# Patient Record
Sex: Male | Born: 1990
Health system: Southern US, Community
[De-identification: ages and names within clinical notes are randomized; demographics above are authoritative.]

## PROBLEM LIST (undated history)

## (undated) HISTORY — PX: NO PAST SURGERIES: SHX2092

---

## 2012-11-16 ENCOUNTER — Ambulatory Visit (INDEPENDENT_AMBULATORY_CARE_PROVIDER_SITE_OTHER): Payer: BC Managed Care – PPO | Admitting: Physician Assistant

## 2012-11-16 VITALS — BP 110/78 | HR 88 | Temp 97.5°F | Resp 16 | Wt 199.0 lb

## 2012-11-16 DIAGNOSIS — Z113 Encounter for screening for infections with a predominantly sexual mode of transmission: Secondary | ICD-10-CM

## 2012-11-16 DIAGNOSIS — R369 Urethral discharge, unspecified: Secondary | ICD-10-CM

## 2012-11-16 MED ORDER — AZITHROMYCIN 500 MG PO TABS: 1000.0000 mg | ORAL_TABLET | Freq: Once | ORAL | Status: DC

## 2012-11-16 MED ORDER — CEFTRIAXONE SODIUM 1 G IJ SOLR
250.0000 mg | Freq: Once | INTRAMUSCULAR | Status: AC
  Administered 2012-11-16: 250 mg via INTRAMUSCULAR

## 2012-11-16 NOTE — Progress Notes (Signed)
  Subjective:    Patient ID: Raymond Knapp, male    DOB: May 11, 1990, 22 y.o.   MRN: [id number]  HPI   [name] is a very pleasant 22 yr old male here with concern for a couple weeks of penile discharge.  States that for the past month "something felt different" but he had no visible symptoms.  Within the last couple weeks he has noted a small amount of discharge from the penis.  Was clear at first, now kind of whitish.  He denies dysuria, testicular pain, penile pain.  He denies rashes, lesions, sores.  He was sexually active in early September with a male partner.  He did not use a condom during that encounter.  He has not been active since.  He is no longer with this partner.  He was tested at the health department shortly after that encounter - assumes he tested negative as he never received a phone call.  He was asymptomatic at the time and was not treated for anything.  He otherwise feels well today.     Review of Systems  Constitutional: Negative for fever and chills.  HENT: Negative.   Respiratory: Negative.   Cardiovascular: Negative.   Gastrointestinal: Negative for nausea, vomiting and abdominal pain.  Genitourinary: Positive for discharge. Negative for dysuria, frequency, hematuria, flank pain, penile swelling, scrotal swelling, genital sores, penile pain and testicular pain.  Musculoskeletal: Negative.   Skin: Negative.   Neurological: Negative.        Objective:   Physical Exam  Vitals reviewed. Constitutional: He is oriented to person, place, and time. He appears well-developed and well-nourished. No distress.  HENT:  Head: Normocephalic and atraumatic.  Eyes: Conjunctivae are normal. No scleral icterus.  Cardiovascular: Normal rate, regular rhythm and normal heart sounds.   Pulmonary/Chest: Effort normal and breath sounds normal. He has no wheezes. He has no rales.  Abdominal: Soft. There is no tenderness.  Neurological: He is alert and oriented to person, place, and  time.  Skin: Skin is warm and dry.  Psychiatric: He has a normal mood and affect. His behavior is normal.        Assessment & Plan:  Penile discharge - Plan: GC/Chlamydia Probe Amp, azithromycin (ZITHROMAX) 500 MG tablet, cefTRIAXone (ROCEPHIN) injection 250 mg  Screen for STD (sexually transmitted disease) - Plan: GC/Chlamydia Probe Amp, RPR, HIV antibody   [name] is a very pleasant 22 yr old male complaining of penile discharge.  He had unprotected sex in early Sept - STI testing immediately after that encounter was negative.  Given symptoms will treat empirically with ceftriaxone 250mg  IM and azithro 1g PO.  Uriprobe pending.  HIV and RPR also pending.  Discussed rationale for empiric treatment and pt agrees with this plan.  Discussed importance of condom use with every sexual encounter.  Will follow up on labs and treat further if necessary.  Pt to RTC if symptoms worsening or not improving.

## 2012-11-16 NOTE — Patient Instructions (Signed)
We have given you an injection of antibiotics in clinic today.  You also need to take the two pills that were sent to your pharmacy at once today.  These medicines will cover your for both gonorrhea and chlamydia.  No sexual activity for the next 7 days to make sure any potential infection has cleared.  Condoms with every sexual encounter going forward.  I will let you know as soon as your labs are back.  Please let us know if any symptoms worsen or do not improve.   Safe Sex Safe sex is about reducing the risk of giving or getting a sexually transmitted disease (STD). STDs are spread through sexual contact involving the genitals, mouth, or rectum. Some STDS can be cured and others cannot. Safe sex can also prevent unintended pregnancies.  SAFE SEX PRACTICES  Limit your sexual activity to only one partner who is only having sex with you.  Talk to your partner about their past partners, past STDs, and drug use.  Use a condom every time you have sexual intercourse. This includes vaginal, oral, and anal sexual activity. Both females and males should wear condoms during oral sex. Only use latex or polyurethane condoms and water-based lubricants. Petroleum-based lubricants or oils used to lubricate a condom will weaken the condom and increase the chance that it will break. The condom should be in place from the beginning to the end of sexual activity. Wearing a condom reduces, but does not completely eliminate, your risk of getting or giving a STD. STDs can be spread by contact with skin of surrounding areas.  Get vaccinated for hepatitis B and HPV.  Avoid alcohol and recreational drugs which can affect your judgement. You may forget to use a condom or participate in high-risk sex.  For females, avoid douching after sexual intercourse. Douching can spread an infection farther into the reproductive tract.  Check your body for signs of sores, blisters, rashes, or unusual discharge. See your caregiver if  you notice any of these signs.  Avoid sexual contact if you have symptoms of an infection or are being treated for an STD. If you or your partner has herpes, avoid sexual contact when blisters are present. Use condoms at all other times.  See your caregiver for regular screenings, examinations, and tests for STDs. Before having sex with a new partner, each of you should be screened for STDs and talk about the results with your partner. BENEFITS OF SAFE SEX   There is less of a chance of getting or giving an STD.  You can prevent unwanted or unintended pregnancies.  By discussing safer sex concerns with your partner, you may increase feelings of intimacy, comfort, trust, and honesty between the both of you. Document Released: 02/08/2004 Document Revised: 09/25/2011 Document Reviewed: 06/24/2011 [facility] Patient Information 2014 [place], [place].

## 2012-11-17 LAB — GC/CHLAMYDIA PROBE AMP: GC Probe RNA: NEGATIVE

## 2012-11-17 LAB — RPR

## 2012-11-18 ENCOUNTER — Encounter: Payer: Self-pay | Admitting: *Deleted

## 2015-04-14 DIAGNOSIS — H52223 Regular astigmatism, bilateral: Secondary | ICD-10-CM | POA: Diagnosis not present

## 2015-04-14 DIAGNOSIS — H5213 Myopia, bilateral: Secondary | ICD-10-CM | POA: Diagnosis not present

## 2016-02-22 DIAGNOSIS — M545 Low back pain: Secondary | ICD-10-CM | POA: Diagnosis not present

## 2016-02-22 DIAGNOSIS — G8929 Other chronic pain: Secondary | ICD-10-CM | POA: Diagnosis not present

## 2016-02-23 ENCOUNTER — Other Ambulatory Visit: Payer: Self-pay | Admitting: Orthopedic Surgery

## 2016-02-23 DIAGNOSIS — R202 Paresthesia of skin: Secondary | ICD-10-CM

## 2016-02-23 DIAGNOSIS — R52 Pain, unspecified: Secondary | ICD-10-CM

## 2016-02-23 DIAGNOSIS — R531 Weakness: Secondary | ICD-10-CM

## 2016-02-29 ENCOUNTER — Ambulatory Visit
Admission: RE | Admit: 2016-02-29 | Discharge: 2016-02-29 | Disposition: A | Discharge status: Home / Self Care | Payer: 59 | Source: Ambulatory Visit | Attending: Orthopedic Surgery | Admitting: Orthopedic Surgery

## 2016-02-29 DIAGNOSIS — G548 Other nerve root and plexus disorders: Secondary | ICD-10-CM | POA: Diagnosis not present

## 2016-02-29 DIAGNOSIS — R52 Pain, unspecified: Secondary | ICD-10-CM

## 2016-02-29 DIAGNOSIS — R531 Weakness: Secondary | ICD-10-CM

## 2016-02-29 DIAGNOSIS — R202 Paresthesia of skin: Secondary | ICD-10-CM

## 2016-02-29 DIAGNOSIS — M48061 Spinal stenosis, lumbar region without neurogenic claudication: Secondary | ICD-10-CM | POA: Diagnosis not present

## 2016-02-29 DIAGNOSIS — M5127 Other intervertebral disc displacement, lumbosacral region: Secondary | ICD-10-CM | POA: Diagnosis not present

## 2016-03-01 DIAGNOSIS — M5417 Radiculopathy, lumbosacral region: Secondary | ICD-10-CM | POA: Diagnosis not present

## 2016-03-01 DIAGNOSIS — M5127 Other intervertebral disc displacement, lumbosacral region: Secondary | ICD-10-CM | POA: Diagnosis not present

## 2016-11-14 ENCOUNTER — Encounter: Payer: Self-pay | Admitting: Emergency Medicine

## 2016-11-14 ENCOUNTER — Ambulatory Visit (INDEPENDENT_AMBULATORY_CARE_PROVIDER_SITE_OTHER): Payer: 59 | Admitting: Emergency Medicine

## 2016-11-14 VITALS — BP 102/60 | HR 95 | Temp 98.4°F | Resp 16 | Ht 74.5 in | Wt 226.8 lb

## 2016-11-14 DIAGNOSIS — R1030 Lower abdominal pain, unspecified: Secondary | ICD-10-CM | POA: Diagnosis not present

## 2016-11-14 LAB — POCT URINALYSIS DIP (MANUAL ENTRY)
BILIRUBIN UA: NEGATIVE
GLUCOSE UA: NEGATIVE mg/dL
Ketones, POC UA: NEGATIVE mg/dL
Leukocytes, UA: NEGATIVE
NITRITE UA: NEGATIVE
Protein Ur, POC: NEGATIVE mg/dL
RBC UA: NEGATIVE
Spec Grav, UA: 1.02 (ref 1.010–1.025)
Urobilinogen, UA: 1 E.U./dL
pH, UA: 7 (ref 5.0–8.0)

## 2016-11-14 NOTE — Patient Instructions (Signed)
     IF you received an x-ray today, you will receive an invoice from [facility]. Please contact [facility] at [phone] with questions or concerns regarding your invoice.   IF you received labwork today, you will receive an invoice from [facility]. Please contact [facility] at [phone] with questions or concerns regarding your invoice.   Our billing staff will not be able to assist you with questions regarding bills from these companies.  You will be contacted with the lab results as soon as they are available. The fastest way to get your results is to activate your My Chart account. Instructions are located on the last page of this paperwork. If you have not heard from us regarding the results in 2 weeks, please contact this office.     

## 2016-11-14 NOTE — Progress Notes (Signed)
Raymond Knapp 26 y.o.   Chief Complaint  Patient presents with  . Groin Pain    x 2 weeks on and off    HISTORY OF PRESENT ILLNESS: [name] complaining of brief episodes of shooting sharp pain to groin area on and off x 2 weeks; pain lasts no more than a few seconds and is not associated with any other significant symptoms; triggered by nothing in particular; no pain with exercise. No urinary symptoms; no change in BM; no testicular lumps or masses.  Groin Pain  The patient's primary symptoms include pelvic pain and testicular pain. The patient's pertinent negatives include no genital injury, genital itching, genital lesions, penile discharge, penile pain, priapism or scrotal swelling. This is a new problem. The current episode started 1 to 4 weeks ago. The problem occurs intermittently. The problem has been waxing and waning. The pain is mild. Pertinent negatives include no abdominal pain, chest pain, chills, constipation, coughing, diarrhea, discolored urine, dysuria, fever, flank pain, frequency, headaches, hematuria, joint pain, nausea, painful intercourse, rash, shortness of breath, sore throat, urgency, urinary retention or vomiting.     Prior to Admission medications   Not on File    No Known Allergies  There are no active problems to display for this patient.   No past medical history on file.  No past surgical history on file.  Social History   Social History  . Marital status: Single    Spouse name: N/A  . Number of children: N/A  . Years of education: N/A   Occupational History  . Not on file.   Social History Main Topics  . Smoking status: Never Smoker  . Smokeless tobacco: Never Used  . Alcohol use No  . Drug use: No  . Sexual activity: Yes   Other Topics Concern  . Not on file   Social History Narrative  . No narrative on file    No family history on file.   Review of Systems  Constitutional: Negative for chills, fever and weight  loss.  HENT: Negative.  Negative for sore throat.   Eyes: Negative.   Respiratory: Negative for cough and shortness of breath.   Cardiovascular: Negative for chest pain, palpitations and leg swelling.  Gastrointestinal: Negative for abdominal pain, blood in stool, constipation, diarrhea, nausea and vomiting.  Genitourinary: Positive for pelvic pain and testicular pain. Negative for discharge, dysuria, flank pain, frequency, hematuria, penile pain, scrotal swelling and urgency.  Musculoskeletal: Negative for joint pain, myalgias and neck pain.  Skin: Negative for rash.  Neurological: Negative for dizziness and headaches.  Endo/Heme/Allergies: Negative.   All other systems reviewed and are negative.   Vitals:   11/14/16 1533  BP: 102/60  Pulse: 95  Resp: 16  Temp: 98.4 F (36.9 C)  SpO2: 99%    Physical Exam  Constitutional: He is oriented to person, place, and time. He appears well-developed and well-nourished.  HENT:  Head: Normocephalic and atraumatic.  Nose: Nose normal.  Mouth/Throat: Oropharynx is clear and moist.  Eyes: Pupils are equal, round, and reactive to light. Conjunctivae and EOM are normal.  Neck: Normal range of motion. Neck supple. No JVD present. No thyromegaly present.  Cardiovascular: Normal rate, regular rhythm and normal heart sounds.   Pulmonary/Chest: Effort normal and breath sounds normal.  Abdominal: Soft. Bowel sounds are normal. He exhibits no distension and no mass. There is no tenderness. There is no rebound and no guarding. No hernia. Hernia confirmed negative  in the right inguinal area and confirmed negative in the left inguinal area.  Genitourinary: Testes normal and penis normal. Cremasteric reflex is present. Right testis shows no mass, no swelling and no tenderness. Cremasteric reflex is not absent on the right side. Left testis shows no mass, no swelling and no tenderness. Cremasteric reflex is not absent on the left side. Uncircumcised. No  penile erythema or penile tenderness. No discharge found.  Musculoskeletal: Normal range of motion.  Lymphadenopathy:    He has no cervical adenopathy. No inguinal adenopathy noted on the right or left side.  Neurological: He is alert and oriented to person, place, and time. No sensory deficit. He exhibits normal muscle tone.  Skin: Skin is warm and dry. Capillary refill takes less than 2 seconds. No rash noted.  Psychiatric: He has a normal mood and affect. His behavior is normal.  Vitals reviewed.   Results for orders placed or performed in visit on 11/14/16 (from the past 24 hour(s))  POCT urinalysis dipstick     Status: None   Collection Time: 11/14/16  4:06 PM  Result Value Ref Range   Color, UA yellow yellow   Clarity, UA clear clear   Glucose, UA negative negative mg/dL   Bilirubin, UA negative negative   Ketones, POC UA negative negative mg/dL   Spec Grav, UA 4.098 1.191 - 1.025   Blood, UA negative negative   pH, UA 7.0 5.0 - 8.0   Protein Ur, POC negative negative mg/dL   Urobilinogen, UA 1.0 0.2 or 1.0 E.U./dL   Nitrite, UA Negative Negative   Leukocytes, UA Negative Negative    ASSESSMENT & PLAN: Raymond Knapp was seen today for groin pain.  Diagnoses and all orders for this visit:  Inguinal pain, unspecified laterality -     CBC with Differential/Platelet -     Comprehensive metabolic panel -     Urine Culture -     POCT urinalysis dipstick -     US Scrotum; Future   Patient Instructions       IF you received an x-ray today, you will receive an invoice from [facility]. Please contact [facility] at [phone] with questions or concerns regarding your invoice.   IF you received labwork today, you will receive an invoice from [facility]. Please contact [facility] at [phone] with questions or concerns regarding your invoice.   Our billing staff will not be able to assist you with questions regarding bills from these companies.  You will be  contacted with the lab results as soon as they are available. The fastest way to get your results is to activate your My Chart account. Instructions are located on the last page of this paperwork. If you have not heard from [place] regarding the results in 2 weeks, please contact this office.         Edwina Barth, MD [facility]

## 2016-11-15 LAB — CBC WITH DIFFERENTIAL/PLATELET
BASOS ABS: 0.1 10*3/uL (ref 0.0–0.2)
Basos: 1 %
EOS (ABSOLUTE): 0.2 10*3/uL (ref 0.0–0.4)
Eos: 2 %
Hematocrit: 45.2 % (ref 37.5–51.0)
Hemoglobin: 15.7 g/dL (ref 13.0–17.7)
Immature Grans (Abs): 0 10*3/uL (ref 0.0–0.1)
Immature Granulocytes: 0 %
LYMPHS ABS: 1.8 10*3/uL (ref 0.7–3.1)
Lymphs: 26 %
MCH: 28.9 pg (ref 26.6–33.0)
MCHC: 34.7 g/dL (ref 31.5–35.7)
MCV: 83 fL (ref 79–97)
MONOCYTES: 12 %
Monocytes Absolute: 0.9 10*3/uL (ref 0.1–0.9)
NEUTROS PCT: 59 %
Neutrophils Absolute: 4.2 10*3/uL (ref 1.4–7.0)
Platelets: 243 10*3/uL (ref 150–379)
RBC: 5.43 x10E6/uL (ref 4.14–5.80)
RDW: 13.2 % (ref 12.3–15.4)
WBC: 7.1 10*3/uL (ref 3.4–10.8)

## 2016-11-15 LAB — COMPREHENSIVE METABOLIC PANEL
ALBUMIN: 4.7 g/dL (ref 3.5–5.5)
ALT: 30 IU/L (ref 0–44)
AST: 21 IU/L (ref 0–40)
Albumin/Globulin Ratio: 1.8 (ref 1.2–2.2)
Alkaline Phosphatase: 86 IU/L (ref 39–117)
BUN/Creatinine Ratio: 17 (ref 9–20)
BUN: 17 mg/dL (ref 6–20)
Bilirubin Total: 0.7 mg/dL (ref 0.0–1.2)
CALCIUM: 9.5 mg/dL (ref 8.7–10.2)
CO2: 26 mmol/L (ref 20–29)
CREATININE: 0.99 mg/dL (ref 0.76–1.27)
Chloride: 100 mmol/L (ref 96–106)
GFR calc Af Amer: 122 mL/min/{1.73_m2} (ref >59)
GFR, EST NON AFRICAN AMERICAN: 105 mL/min/{1.73_m2} (ref >59)
GLUCOSE: 99 mg/dL (ref 65–99)
Globulin, Total: 2.6 g/dL (ref 1.5–4.5)
Potassium: 4.1 mmol/L (ref 3.5–5.2)
Sodium: 142 mmol/L (ref 134–144)
TOTAL PROTEIN: 7.3 g/dL (ref 6.0–8.5)

## 2016-11-15 LAB — URINE CULTURE: Organism ID, Bacteria: NO GROWTH

## 2016-11-19 ENCOUNTER — Other Ambulatory Visit: Payer: Self-pay | Admitting: Emergency Medicine

## 2016-11-19 DIAGNOSIS — R1030 Lower abdominal pain, unspecified: Secondary | ICD-10-CM

## 2016-12-11 ENCOUNTER — Ambulatory Visit: Payer: 59 | Admitting: Internal Medicine

## 2017-05-28 ENCOUNTER — Ambulatory Visit (INDEPENDENT_AMBULATORY_CARE_PROVIDER_SITE_OTHER): Payer: 59 | Admitting: Emergency Medicine

## 2017-05-28 ENCOUNTER — Encounter: Payer: Self-pay | Admitting: Emergency Medicine

## 2017-05-28 ENCOUNTER — Other Ambulatory Visit: Payer: Self-pay

## 2017-05-28 VITALS — BP 100/60 | HR 54 | Temp 97.9°F | Resp 16 | Ht 75.0 in | Wt 237.0 lb

## 2017-05-28 DIAGNOSIS — Z0000 Encounter for general adult medical examination without abnormal findings: Secondary | ICD-10-CM

## 2017-05-28 DIAGNOSIS — Z111 Encounter for screening for respiratory tuberculosis: Secondary | ICD-10-CM

## 2017-05-28 NOTE — Patient Instructions (Addendum)
   IF you received an x-ray today, you will receive an invoice from [facility]. Please contact [facility] at [phone] with questions or concerns regarding your invoice.   IF you received labwork today, you will receive an invoice from [facility]. Please contact [facility] at [phone] with questions or concerns regarding your invoice.   Our billing staff will not be able to assist you with questions regarding bills from these companies.  You will be contacted with the lab results as soon as they are available. The fastest way to get your results is to activate your My Chart account. Instructions are located on the last page of this paperwork. If you have not heard from us regarding the results in 2 weeks, please contact this office.      Health Maintenance, Male A healthy lifestyle and preventive care is important for your health and wellness. Ask your health care provider about what schedule of regular examinations is right for you. What should I know about weight and diet? Eat a Healthy Diet  Eat plenty of vegetables, fruits, whole grains, low-fat dairy products, and lean protein.  Do not eat a lot of foods high in solid fats, added sugars, or salt.  Maintain a Healthy Weight Regular exercise can help you achieve or maintain a healthy weight. You should:  Do at least 150 minutes of exercise each week. The exercise should increase your heart rate and make you sweat (moderate-intensity exercise).  Do strength-training exercises at least twice a week.  Watch Your Levels of Cholesterol and Blood Lipids  Have your blood tested for lipids and cholesterol every 5 years starting at 26 years of age. If you are at high risk for heart disease, you should start having your blood tested when you are 26 years old. You may need to have your cholesterol levels checked more often if: ? Your lipid or cholesterol levels are high. ? You are older than 26 years of age. ? You  are at high risk for heart disease.  What should I know about cancer screening? Many types of cancers can be detected early and may often be prevented. Lung Cancer  You should be screened every year for lung cancer if: ? You are a current smoker who has smoked for at least 30 years. ? You are a former smoker who has quit within the past 15 years.  Talk to your health care provider about your screening options, when you should start screening, and how often you should be screened.  Colorectal Cancer  Routine colorectal cancer screening usually begins at 26 years of age and should be repeated every 5-10 years until you are 26 years old. You may need to be screened more often if early forms of precancerous polyps or small growths are found. Your health care provider may recommend screening at an earlier age if you have risk factors for colon cancer.  Your health care provider may recommend using home test kits to check for hidden blood in the stool.  A small camera at the end of a tube can be used to examine your colon (sigmoidoscopy or colonoscopy). This checks for the earliest forms of colorectal cancer.  Prostate and Testicular Cancer  Depending on your age and overall health, your health care provider may do certain tests to screen for prostate and testicular cancer.  Talk to your health care provider about any symptoms or concerns you have about testicular or prostate cancer.  Skin Cancer  Check your skin   from head to toe regularly.  Tell your health care provider about any new moles or changes in moles, especially if: ? There is a change in a mole's size, shape, or color. ? You have a mole that is larger than a pencil eraser.  Always use sunscreen. Apply sunscreen liberally and repeat throughout the day.  Protect yourself by wearing long sleeves, pants, a wide-brimmed hat, and sunglasses when outside.  What should I know about heart disease, diabetes, and high blood  pressure?  If you are 18-39 years of age, have your blood pressure checked every 3-5 years. If you are 40 years of age or older, have your blood pressure checked every year. You should have your blood pressure measured twice-once when you are at a hospital or clinic, and once when you are not at a hospital or clinic. Record the average of the two measurements. To check your blood pressure when you are not at a hospital or clinic, you can use: ? An automated blood pressure machine at a pharmacy. ? A home blood pressure monitor.  Talk to your health care provider about your target blood pressure.  If you are between 45-79 years old, ask your health care provider if you should take aspirin to prevent heart disease.  Have regular diabetes screenings by checking your fasting blood sugar level. ? If you are at a normal weight and have a low risk for diabetes, have this test once every three years after the age of 45. ? If you are overweight and have a high risk for diabetes, consider being tested at a younger age or more often.  A one-time screening for abdominal aortic aneurysm (AAA) by ultrasound is recommended for men aged 65-75 years who are current or former smokers. What should I know about preventing infection? Hepatitis B If you have a higher risk for hepatitis B, you should be screened for this virus. Talk with your health care provider to find out if you are at risk for hepatitis B infection. Hepatitis C Blood testing is recommended for:  Everyone born from 1945 through 1965.  Anyone with known risk factors for hepatitis C.  Sexually Transmitted Diseases (STDs)  You should be screened each year for STDs including gonorrhea and chlamydia if: ? You are sexually active and are younger than 26 years of age. ? You are older than 26 years of age and your health care provider tells you that you are at risk for this type of infection. ? Your sexual activity has changed since you were last  screened and you are at an increased risk for chlamydia or gonorrhea. Ask your health care provider if you are at risk.  Talk with your health care provider about whether you are at high risk of being infected with HIV. Your health care provider may recommend a prescription medicine to help prevent HIV infection.  What else can I do?  Schedule regular health, dental, and eye exams.  Stay current with your vaccines (immunizations).  Do not use any tobacco products, such as cigarettes, chewing tobacco, and e-cigarettes. If you need help quitting, ask your health care provider.  Limit alcohol intake to no more than 2 drinks per day. One drink equals 12 ounces of beer, 5 ounces of wine, or 1 ounces of hard liquor.  Do not use street drugs.  Do not share needles.  Ask your health care provider for help if you need support or information about quitting drugs.  Tell your health care   provider if you often feel depressed.  Tell your health care provider if you have ever been abused or do not feel safe at home. This information is not intended to replace advice given to you by your health care provider. Make sure you discuss any questions you have with your health care provider. Document Released: 06/29/2007 Document Revised: 08/30/2015 Document Reviewed: 10/04/2014 Elsevier Interactive Patient Education  2018 [employer]  

## 2017-05-28 NOTE — Progress Notes (Signed)
PPD Placement note Raymond Knapp, 27 y.o. male is here today for placement of PPD test Reason for PPD test: [employer] Pt taken PPD test before: no Verified in allergy area and with patient that they are not allergic to the products PPD is made of (Phenol or Tween). Yes Is patient taking any oral or IV steroid medication now or have they taken it in the last month? no Has the patient ever received the BCG vaccine?: no Has the patient been in recent contact with anyone known or suspected of having active TB disease?: no      Date of exposure (if applicable): N/a      Name of person they were exposed to (if applicable): N/a Patient's Country of origin?: [place] O: Alert and oriented in NAD. P:  PPD placed on 05/28/2017.  Patient advised to return for reading within 48-72 hours.

## 2017-05-28 NOTE — Progress Notes (Signed)
Raymond Knapp 26 y.o.   Chief Complaint  Patient presents with  . Annual Exam    to be a IT sales professional in [place]    HISTORY OF PRESENT ILLNESS: This is a 27 y.o. male Here for annual exam; no complaints and no medical concerns.   HPI   Prior to Admission medications   Not on File    No Known Allergies  Patient Active Problem List   Diagnosis Date Noted  . Inguinal pain 11/14/2016    No past medical history on file.  No past surgical history on file.  Social History   Socioeconomic History  . Marital status: Single    Spouse name: Not on file  . Number of children: Not on file  . Years of education: Not on file  . Highest education level: Not on file  Occupational History  . Not on file  Social Needs  . Financial resource strain: Not on file  . Food insecurity:    Worry: Not on file    Inability: Not on file  . Transportation needs:    Medical: Not on file    Non-medical: Not on file  Tobacco Use  . Smoking status: Never Smoker  . Smokeless tobacco: Never Used  Substance and Sexual Activity  . Alcohol use: No  . Drug use: No  . Sexual activity: Yes  Lifestyle  . Physical activity:    Days per week: Not on file    Minutes per session: Not on file  . Stress: Not on file  Relationships  . Social connections:    Talks on phone: Not on file    Gets together: Not on file    Attends religious service: Not on file    Active member of club or organization: Not on file    Attends meetings of clubs or organizations: Not on file    Relationship status: Not on file  . Intimate partner violence:    Fear of current or ex partner: Not on file    Emotionally abused: Not on file    Physically abused: Not on file    Forced sexual activity: Not on file  Other Topics Concern  . Not on file  Social History Narrative  . Not on file    No family history on file.   Review of Systems  Constitutional: Negative.  Negative for chills, fever, malaise/fatigue and weight  loss.  HENT: Negative for ear pain, hearing loss and sore throat.   Eyes: Negative.  Negative for blurred vision, double vision, discharge and redness.  Respiratory: Negative.  Negative for cough, hemoptysis, shortness of breath and wheezing.   Cardiovascular: Negative.  Negative for chest pain and palpitations.  Gastrointestinal: Negative.  Negative for abdominal pain, blood in stool, diarrhea, nausea and vomiting.  Genitourinary: Negative.  Negative for dysuria and hematuria.  Musculoskeletal: Negative.  Negative for back pain, myalgias and neck pain.  Skin: Negative.  Negative for rash.  Neurological: Negative.  Negative for dizziness and headaches.  Endo/Heme/Allergies: Negative.   All other systems reviewed and are negative.  Vitals:   05/28/17 1006  BP: 100/60  Pulse: (!) 54  Resp: 16  Temp: 97.9 F (36.6 C)     Physical Exam  Constitutional: He is oriented to person, place, and time. He appears well-developed and well-nourished.  HENT:  Head: Normocephalic and atraumatic.  Right Ear: External ear normal.  Left Ear: External ear normal.  Nose: Nose normal.  Mouth/Throat: Oropharynx is clear and moist.  Eyes: Pupils are equal, round, and reactive to light. Conjunctivae and EOM are normal.  Neck: Normal range of motion. Neck supple. No JVD present.  Cardiovascular: Normal rate, regular rhythm and normal heart sounds.  Pulmonary/Chest: Effort normal and breath sounds normal. No respiratory distress.  Abdominal: Soft. Bowel sounds are normal. He exhibits no distension and no mass. There is no tenderness. There is no rebound. No hernia.  Genitourinary: Rectum normal.  Musculoskeletal: Normal range of motion. He exhibits no edema or tenderness.  Lymphadenopathy:    He has no cervical adenopathy.  Neurological: He is alert and oriented to person, place, and time. No sensory deficit. He exhibits normal muscle tone. Coordination normal.  Skin: Skin is warm and dry. Capillary  refill takes less than 2 seconds. No rash noted.  Psychiatric: He has a normal mood and affect. His behavior is normal.  Vitals reviewed.    ASSESSMENT & PLAN: Raymond Knapp was seen today for annual exam.  Diagnoses and all orders for this visit:  Routine general medical examination at a health care facility -     CBC with Differential -     Comprehensive metabolic panel -     Lipid panel -     Hemoglobin A1c  PPD screening test -     TB Skin Test    Patient Instructions       IF you received an x-ray today, you will receive an invoice from [facility]. Please contact [facility] at [phone] with questions or concerns regarding your invoice.   IF you received labwork today, you will receive an invoice from [facility]. Please contact [facility] at [phone] with questions or concerns regarding your invoice.   Our billing staff will not be able to assist you with questions regarding bills from these companies.  You will be contacted with the lab results as soon as they are available. The fastest way to get your results is to activate your My Chart account. Instructions are located on the last page of this paperwork. If you have not heard from [place] regarding the results in 2 weeks, please contact this office.      Health Maintenance, Male A healthy lifestyle and preventive care is important for your health and wellness. Ask your health care provider about what schedule of regular examinations is right for you. What should I know about weight and diet? Eat a Healthy Diet  Eat plenty of vegetables, fruits, whole grains, low-fat dairy products, and lean protein.  Do not eat a lot of foods high in solid fats, added sugars, or salt.  Maintain a Healthy Weight Regular exercise can help you achieve or maintain a healthy weight. You should:  Do at least 150 minutes of exercise each week. The exercise should increase your heart rate and make you sweat  (moderate-intensity exercise).  Do strength-training exercises at least twice a week.  Watch Your Levels of Cholesterol and Blood Lipids  Have your blood tested for lipids and cholesterol every 5 years starting at 26 years of age. If you are at high risk for heart disease, you should start having your blood tested when you are 26 years old. You may need to have your cholesterol levels checked more often if: ? Your lipid or cholesterol levels are high. ? You are older than 26 years of age. ? You are at high risk for heart disease.  What should I know about cancer screening? Many types of cancers can be detected early and may often be  prevented. Lung Cancer  You should be screened every year for lung cancer if: ? You are a current smoker who has smoked for at least 30 years. ? You are a former smoker who has quit within the past 15 years.  Talk to your health care provider about your screening options, when you should start screening, and how often you should be screened.  Colorectal Cancer  Routine colorectal cancer screening usually begins at 26 years of age and should be repeated every 5-10 years until you are 26 years old. You may need to be screened more often if early forms of precancerous polyps or small growths are found. Your health care provider may recommend screening at an earlier age if you have risk factors for colon cancer.  Your health care provider may recommend using home test kits to check for hidden blood in the stool.  A small camera at the end of a tube can be used to examine your colon (sigmoidoscopy or colonoscopy). This checks for the earliest forms of colorectal cancer.  Prostate and Testicular Cancer  Depending on your age and overall health, your health care provider may do certain tests to screen for prostate and testicular cancer.  Talk to your health care provider about any symptoms or concerns you have about testicular or prostate cancer.  Skin  Cancer  Check your skin from head to toe regularly.  Tell your health care provider about any new moles or changes in moles, especially if: ? There is a change in a mole's size, shape, or color. ? You have a mole that is larger than a pencil eraser.  Always use sunscreen. Apply sunscreen liberally and repeat throughout the day.  Protect yourself by wearing long sleeves, pants, a wide-brimmed hat, and sunglasses when outside.  What should I know about heart disease, diabetes, and high blood pressure?  If you are 3718-26 years of age, have your blood pressure checked every 3-5 years. If you are 26 years of age or older, have your blood pressure checked every year. You should have your blood pressure measured twice-once when you are at a hospital or clinic, and once when you are not at a hospital or clinic. Record the average of the two measurements. To check your blood pressure when you are not at a hospital or clinic, you can use: ? An automated blood pressure machine at a pharmacy. ? A home blood pressure monitor.  Talk to your health care provider about your target blood pressure.  If you are between 6145-26 years old, ask your health care provider if you should take aspirin to prevent heart disease.  Have regular diabetes screenings by checking your fasting blood sugar level. ? If you are at a normal weight and have a low risk for diabetes, have this test once every three years after the age of 26. ? If you are overweight and have a high risk for diabetes, consider being tested at a younger age or more often.  A one-time screening for abdominal aortic aneurysm (AAA) by ultrasound is recommended for men aged 65-75 years who are current or former smokers. What should I know about preventing infection? Hepatitis B If you have a higher risk for hepatitis B, you should be screened for this virus. Talk with your health care provider to find out if you are at risk for hepatitis B  infection. Hepatitis C Blood testing is recommended for:  Everyone born from [id number] through 1964.  Anyone with known risk factors  for hepatitis C.  Sexually Transmitted Diseases (STDs)  You should be screened each year for STDs including gonorrhea and chlamydia if: ? You are sexually active and are younger than 27 years of age. ? You are older than 26 years of age and your health care provider tells you that you are at risk for this type of infection. ? Your sexual activity has changed since you were last screened and you are at an increased risk for chlamydia or gonorrhea. Ask your health care provider if you are at risk.  Talk with your health care provider about whether you are at high risk of being infected with HIV. Your health care provider may recommend a prescription medicine to help prevent HIV infection.  What else can I do?  Schedule regular health, dental, and eye exams.  Stay current with your vaccines (immunizations).  Do not use any tobacco products, such as cigarettes, chewing tobacco, and e-cigarettes. If you need help quitting, ask your health care provider.  Limit alcohol intake to no more than 2 drinks per day. One drink equals 12 ounces of beer, 5 ounces of wine, or 1 ounces of hard liquor.  Do not use street drugs.  Do not share needles.  Ask your health care provider for help if you need support or information about quitting drugs.  Tell your health care provider if you often feel depressed.  Tell your health care provider if you have ever been abused or do not feel safe at home. This information is not intended to replace advice given to you by your health care provider. Make sure you discuss any questions you have with your health care provider. Document Released: 06/29/2007 Document Revised: 08/30/2015 Document Reviewed: 10/04/2014 Elsevier Interactive Patient Education  2018 Elsevier Inc.      Edwina Barth, MD [facility]

## 2017-05-29 LAB — COMPREHENSIVE METABOLIC PANEL
ALBUMIN: 4.8 g/dL (ref 3.5–5.5)
ALK PHOS: 84 IU/L (ref 39–117)
ALT: 65 IU/L — ABNORMAL HIGH (ref 0–44)
AST: 26 IU/L (ref 0–40)
Albumin/Globulin Ratio: 2.1 (ref 1.2–2.2)
BILIRUBIN TOTAL: 0.7 mg/dL (ref 0.0–1.2)
BUN / CREAT RATIO: 11 (ref 9–20)
BUN: 13 mg/dL (ref 6–20)
CO2: 23 mmol/L (ref 20–29)
Calcium: 9.6 mg/dL (ref 8.7–10.2)
Chloride: 102 mmol/L (ref 96–106)
Creatinine, Ser: 1.19 mg/dL (ref 0.76–1.27)
GFR calc Af Amer: 98 mL/min/{1.73_m2} (ref >59)
GFR calc non Af Amer: 84 mL/min/{1.73_m2} (ref >59)
GLOBULIN, TOTAL: 2.3 g/dL (ref 1.5–4.5)
GLUCOSE: 94 mg/dL (ref 65–99)
Potassium: 4.8 mmol/L (ref 3.5–5.2)
SODIUM: 140 mmol/L (ref 134–144)
Total Protein: 7.1 g/dL (ref 6.0–8.5)

## 2017-05-29 LAB — CBC WITH DIFFERENTIAL/PLATELET
Basophils Absolute: 0.1 10*3/uL (ref 0.0–0.2)
Basos: 1 %
EOS (ABSOLUTE): 0.3 10*3/uL (ref 0.0–0.4)
Eos: 5 %
Hematocrit: 47.3 % (ref 37.5–51.0)
Hemoglobin: 15.8 g/dL (ref 13.0–17.7)
IMMATURE GRANS (ABS): 0 10*3/uL (ref 0.0–0.1)
IMMATURE GRANULOCYTES: 0 %
LYMPHS: 31 %
Lymphocytes Absolute: 2.1 10*3/uL (ref 0.7–3.1)
MCH: 28.4 pg (ref 26.6–33.0)
MCHC: 33.4 g/dL (ref 31.5–35.7)
MCV: 85 fL (ref 79–97)
MONOS ABS: 0.6 10*3/uL (ref 0.1–0.9)
Monocytes: 8 %
NEUTROS PCT: 55 %
Neutrophils Absolute: 3.6 10*3/uL (ref 1.4–7.0)
Platelets: 228 10*3/uL (ref 150–379)
RBC: 5.56 x10E6/uL (ref 4.14–5.80)
RDW: 13.5 % (ref 12.3–15.4)
WBC: 6.6 10*3/uL (ref 3.4–10.8)

## 2017-05-29 LAB — LIPID PANEL
CHOLESTEROL TOTAL: 139 mg/dL (ref 100–199)
Chol/HDL Ratio: 3.2 ratio (ref 0.0–5.0)
HDL: 43 mg/dL (ref >39)
LDL Calculated: 81 mg/dL (ref 0–99)
TRIGLYCERIDES: 74 mg/dL (ref 0–149)
VLDL Cholesterol Cal: 15 mg/dL (ref 5–40)

## 2017-05-29 LAB — HEMOGLOBIN A1C
ESTIMATED AVERAGE GLUCOSE: 97 mg/dL
Hgb A1c MFr Bld: 5 % (ref 4.8–5.6)

## 2017-05-31 ENCOUNTER — Encounter: Payer: Self-pay | Admitting: *Deleted

## 2017-05-31 ENCOUNTER — Ambulatory Visit (INDEPENDENT_AMBULATORY_CARE_PROVIDER_SITE_OTHER): Payer: 59 | Admitting: Urgent Care

## 2017-05-31 DIAGNOSIS — Z23 Encounter for immunization: Secondary | ICD-10-CM

## 2017-05-31 LAB — TB SKIN TEST
Induration: 0.2 mm
TB Skin Test: NEGATIVE

## 2017-05-31 NOTE — Progress Notes (Signed)
Immunization read

## 2017-05-31 NOTE — Patient Instructions (Signed)
pcp

## 2017-06-02 DIAGNOSIS — Z0110 Encounter for examination of ears and hearing without abnormal findings: Secondary | ICD-10-CM | POA: Diagnosis not present

## 2017-06-03 ENCOUNTER — Encounter: Payer: Self-pay | Admitting: *Deleted

## 2017-11-05 ENCOUNTER — Other Ambulatory Visit: Payer: Self-pay

## 2017-11-05 ENCOUNTER — Ambulatory Visit: Payer: No Typology Code available for payment source | Admitting: Physician Assistant

## 2017-11-05 ENCOUNTER — Encounter: Payer: Self-pay | Admitting: Physician Assistant

## 2017-11-05 VITALS — BP 126/78 | HR 67 | Temp 98.2°F | Resp 18 | Ht 75.0 in | Wt 234.6 lb

## 2017-11-05 DIAGNOSIS — Z113 Encounter for screening for infections with a predominantly sexual mode of transmission: Secondary | ICD-10-CM | POA: Diagnosis not present

## 2017-11-05 NOTE — Patient Instructions (Addendum)
If you have lab work done today you will be contacted with your lab results within the next 2 weeks.  If you have not heard from [place] then please contact us. The fastest way to get your results is to register for My Chart.   Safe Sex Practicing safe sex means taking steps before and during sex to reduce your risk of:  Getting an STD (sexually transmitted disease).  Giving your partner an STD.  Unwanted pregnancy.  How can I practice safe sex?  To practice safe sex:  Limit your sexual partners to only one partner who is having sex with only you.  Avoid using alcohol and recreational drugs before having sex. These substances can affect your judgment.  Before having sex with a new partner: ? Talk to your partner about past partners, past STDs, and drug use. ? You and your partner should be screened for STDs and discuss the results with each other.  Check your body regularly for sores, blisters, rashes, or unusual discharge. If you notice any of these problems, visit your health care provider.  If you have symptoms of an infection or you are being treated for an STD, avoid sexual contact.  While having sex, use a condom. Make sure to: ? Use a condom every time you have vaginal, oral, or anal sex. Both females and males should wear condoms during oral sex. ? Keep condoms in place from the beginning to the end of sexual activity. ? Use a latex condom, if possible. Latex condoms offer the best protection. ? Use only water-based lubricants or oils to lubricate a condom. Using petroleum-based lubricants or oils will weaken the condom and increase the chance that it will break.  See your health care provider for regular screenings, exams, and tests for STDs.  Talk with your health care provider about the form of birth control (contraception) that is best for you.  Get vaccinated against hepatitis B and human papillomavirus (HPV).  If you are at risk of being infected with HIV  (human immunodeficiency virus), talk with your health care provider about taking a prescription medicine to prevent HIV infection. You are considered at risk for HIV if: ? You are a man who has sex with other men. ? You are a heterosexual man or woman who is sexually active with more than one partner. ? You take drugs by injection. ? You are sexually active with a partner who has HIV.  This information is not intended to replace advice given to you by your health care provider. Make sure you discuss any questions you have with your health care provider. Document Released: 02/08/2004 Document Revised: 05/17/2015 Document Reviewed: 11/20/2014 Elsevier Interactive Patient Education  2018 [employer].   IF you received an x-ray today, you will receive an invoice from [facility]. Please contact [facility] at [phone] with questions or concerns regarding your invoice.   IF you received labwork today, you will receive an invoice from [facility]. Please contact [facility] at [phone] with questions or concerns regarding your invoice.   Our billing staff will not be able to assist you with questions regarding bills from these companies.  You will be contacted with the lab results as soon as they are available. The fastest way to get your results is to activate your My Chart account. Instructions are located on the last page of this paperwork. If you have not heard from [place] regarding the results in 2 weeks, please contact this office.

## 2017-11-05 NOTE — Progress Notes (Signed)
   Raymond Knapp  MRN: [id number] DOB: 22-Jun-1990  Subjective:  Raymond Knapp is a 27 y.o. male seen in office today for a chief complaint of STD testing.  Last STD testing ago: 5 years ago; does not know how many sexual partners since last STD testing. Uses condoms some of the time.  No known exposure.  No symptoms.  Unsure of how many lifetime partners.  No other questions or concerns.   Review of Systems  Constitutional: Negative for chills, diaphoresis and fever.  Gastrointestinal: Negative for abdominal pain, nausea, rectal pain and vomiting.  Genitourinary: Negative for decreased urine volume, difficulty urinating, discharge, dysuria, enuresis, flank pain, frequency, genital sores, penile pain, penile swelling, scrotal swelling, testicular pain and urgency.    Patient Active Problem List   Diagnosis Date Noted  . Inguinal pain 11/14/2016    No current outpatient medications on file prior to visit.   No current facility-administered medications on file prior to visit.     No Known Allergies   Objective:  BP 126/78   Pulse 67   Temp 98.2 F (36.8 C) (Oral)   Resp 18   Ht 6\' 3"  (1.905 m)   Wt 234 lb 9.6 oz (106.4 kg)   SpO2 97%   BMI 29.32 kg/m   Physical Exam  Constitutional: He is oriented to person, place, and time. He appears well-developed and well-nourished.  HENT:  Head: Normocephalic and atraumatic.  Eyes: Conjunctivae are normal.  Neck: Normal range of motion.  Pulmonary/Chest: Effort normal.  Neurological: He is alert and oriented to person, place, and time.  Skin: Skin is warm and dry.  Psychiatric: He has a normal mood and affect.  Vitals reviewed.    BP Readings from Last 3 Encounters:  11/05/17 126/78  05/28/17 100/60  11/14/16 102/60    Assessment and Plan :  1. Screen for STD (sexually transmitted disease) Labs pending. Discussed safe sex practices. F/u as needed.  - GC/Chlamydia Probe Amp - Hepatitis panel, acute - HIV Antibody  (routine testing w rflx) - RPR - Trichomonas vaginalis, RNA   Benjiman Core PA-C  [facility] at [facility] 11/05/2017 9:22 AM

## 2017-11-06 ENCOUNTER — Ambulatory Visit: Payer: 59 | Admitting: Physician Assistant

## 2017-11-06 LAB — RPR: RPR Ser Ql: NONREACTIVE

## 2017-11-06 LAB — GC/CHLAMYDIA PROBE AMP
Chlamydia trachomatis, NAA: NEGATIVE
NEISSERIA GONORRHOEAE BY PCR: NEGATIVE

## 2017-11-06 LAB — TRICHOMONAS VAGINALIS, PROBE AMP: TRICH VAG BY NAA: NEGATIVE

## 2017-11-06 LAB — HEPATITIS PANEL, ACUTE
HEP A IGM: NEGATIVE
HEP B S AG: NEGATIVE
Hep B C IgM: NEGATIVE
Hep C Virus Ab: <0.1 s/co ratio (ref 0.0–0.9)

## 2017-11-06 LAB — HIV ANTIBODY (ROUTINE TESTING W REFLEX): HIV Screen 4th Generation wRfx: NONREACTIVE

## 2018-01-08 ENCOUNTER — Emergency Department (HOSPITAL_BASED_OUTPATIENT_CLINIC_OR_DEPARTMENT_OTHER)
Admission: EM | Admit: 2018-01-08 | Discharge: 2018-01-08 | Disposition: A | Discharge status: Home / Self Care | Payer: No Typology Code available for payment source | Attending: Emergency Medicine | Admitting: Emergency Medicine

## 2018-01-08 ENCOUNTER — Other Ambulatory Visit: Payer: Self-pay

## 2018-01-08 ENCOUNTER — Encounter (HOSPITAL_BASED_OUTPATIENT_CLINIC_OR_DEPARTMENT_OTHER): Payer: Self-pay

## 2018-01-08 DIAGNOSIS — K610 Anal abscess: Secondary | ICD-10-CM | POA: Diagnosis not present

## 2018-01-08 DIAGNOSIS — R600 Localized edema: Secondary | ICD-10-CM | POA: Diagnosis present

## 2018-01-08 MED ORDER — LIDOCAINE (ANORECTAL) 5 % EX CREA: TOPICAL_CREAM | CUTANEOUS | 0 refills | Status: DC

## 2018-01-08 MED ORDER — HYDROCODONE-ACETAMINOPHEN 5-325 MG PO TABS: 1.0000 | ORAL_TABLET | ORAL | 0 refills | Status: DC | PRN

## 2018-01-08 MED ORDER — LIDOCAINE-EPINEPHRINE 2 %-1:100000 IJ SOLN
20.0000 mL | Freq: Once | INTRAMUSCULAR | Status: DC
  Filled 2018-01-08: qty 20

## 2018-01-08 MED ORDER — LIDOCAINE-EPINEPHRINE 1 %-1:100000 IJ SOLN
INTRAMUSCULAR | Status: AC
  Administered 2018-01-08: 07:00:00
  Filled 2018-01-08: qty 1

## 2018-01-08 NOTE — ED Provider Notes (Signed)
[facility] Provider Note: Raymond DellJ. Lane Dioselina Brumbaugh, MD, FACEP  CSN: [id number] MRN: [id number] ARRIVAL: 01/08/18 at 0618 ROOM: MH09/MH09   CHIEF COMPLAINT  Abscess   HISTORY OF PRESENT ILLNESS  01/08/18 6:32 AM Raymond Knapp is a 27 y.o. male with the relatively slow onset of left perianal pain over the past 2 weeks.  Over the past 2 days the pain became severe with a fluctuant, tender mass to the left of his anus.  There is been no bleeding or drainage that he is aware of.  Pain is worse with movement or sitting.  He denies systemic symptoms such as fever.   History reviewed. No pertinent past medical history.  History reviewed. No pertinent surgical history.  No family history on file.  Social History   Tobacco Use  . Smoking status: Never Smoker  . Smokeless tobacco: Never Used  Substance Use Topics  . Alcohol use: No  . Drug use: No    Prior to Admission medications   Not on File    Allergies Patient has no known allergies.   REVIEW OF SYSTEMS  Negative except as noted here or in the History of Present Illness.   PHYSICAL EXAMINATION  Initial Vital Signs Blood pressure (!) 147/96, pulse 88, temperature 98.1 F (36.7 C), temperature source Oral, resp. rate 16, height 6\' 3"  (1.905 m), weight 104.3 kg, SpO2 97 %.  Examination General: Well-developed, well-nourished male in no acute distress; appearance consistent with age of record HENT: normocephalic; atraumatic Eyes: Normal appearance Neck: supple Heart: regular rate and rhythm Lungs: clear to auscultation bilaterally Abdomen: soft; nondistended; nontender; bowel sounds present Rectal: Normal sphincter tone; tender, fluctuant mass to the left of the anus consistent with abscess Extremities: No deformity; full range of motion; pulses normal Neurologic: Awake, alert and oriented; motor function intact in all extremities and symmetric; no facial droop Skin: Warm and dry Psychiatric: Normal mood and  affect   RESULTS  Summary of this visit's results, reviewed by myself:   EKG Interpretation  Date/Time:    Ventricular Rate:    PR Interval:    QRS Duration:   QT Interval:    QTC Calculation:   R Axis:     Text Interpretation:        Laboratory Studies: No results found for this or any previous visit (from the past 24 hour(s)). Imaging Studies: No results found.  ED COURSE and MDM  Nursing notes and initial vitals signs, including pulse oximetry, reviewed.  Vitals:   01/08/18 0629  BP: (!) 147/96  Pulse: 88  Resp: 16  Temp: 98.1 F (36.7 C)  TempSrc: Oral  SpO2: 97%  Weight: 104.3 kg  Height: 6\' 3"  (1.905 m)   Patient was advised to use a stool softener or laxative while the abscess cavity heals.  He was advised to take hot sitz bath.  He was advised to return if symptoms return or worsen.  A large enough incision was made in the abscess that I do not believe packing is necessary.  Consultation with the [facility] [place] substances [place] reveals the patient has received no opioid prescriptions in the past 2 years.   PROCEDURES   INCISION AND DRAINAGE Performed by: Carlisle BeersJohn L Jairo Bellew Consent: Verbal consent obtained. Risks and benefits: risks, benefits and alternatives were discussed Type: abscess  Body area: Left perianal region  Anesthesia: local infiltration  Incision was made with a scalpel.  Local anesthetic: lidocaine 2 % with epinephrine  Anesthetic total:  3 ml  Complexity: complex Blunt dissection to break up loculations  Drainage: purulent  Drainage amount: Copious; abscess cavity digitally explored, approximately 3 to 4 cm deep, does not communicate with rectum  Packing material: None  Patient tolerance: Patient tolerated the procedure well with no immediate complications.   ED DIAGNOSES     ICD-10-CM   1. Perianal abscess K61.0        Jayley Hustead, Jonny RuizJohn, MD 01/08/18 [phone]

## 2018-01-08 NOTE — ED Triage Notes (Signed)
Pt reports abscess on left buttocks.

## 2018-07-08 ENCOUNTER — Ambulatory Visit: Payer: Self-pay | Admitting: Emergency Medicine

## 2018-07-08 NOTE — Telephone Encounter (Signed)
I returned call to pt.  He is c/o sore throat that started yesterday with fatigue, body aches and a headache.   No travel history or known exposures to the COVID-19.  I warm transferred his call to Primary Care at [facility] [place] who is going to schedule him an appt.    Reason for Disposition . [1] Sore throat with cough/cold symptoms AND [2] present > 5 days  Answer Assessment - Initial Assessment Questions 1. ONSET: "When did the throat start hurting?" (Hours or days ago)      Yesterday sore throat, body aches and fatigue and headache. 2. SEVERITY: "How bad is the sore throat?" (Scale 1-10; mild, moderate or severe)   - MILD (1-3):  doesn't interfere with eating or normal activities   - MODERATE (4-7): interferes with eating some solids and normal activities   - SEVERE (8-10):  excruciating pain, interferes with most normal activities   - SEVERE DYSPHAGIA: can't swallow liquids, drooling     I can swallow fine.   My lymph nodes on both side of neck sore. 3. STREP EXPOSURE: "Has there been any exposure to strep within the past week?" If so, ask: "What type of contact occurred?"      No 4.  VIRAL SYMPTOMS: "Are there any symptoms of a cold, such as a runny nose, cough, hoarse voice or red eyes?"      No congestion 5. FEVER: "Do you have a fever?" If so, ask: "What is your temperature, how was it measured, and when did it start?"     Don't think so 6. PUS ON THE TONSILS: "Is there pus on the tonsils in the back of your throat?"     I don't know 7. OTHER SYMPTOMS: "Do you have any other symptoms?" (e.g., difficulty breathing, headache, rash)     Headache, fatigue and body aches 8. PREGNANCY: "Is there any chance you are pregnant?" "When was your last menstrual period?"     N/A  Protocols used: SORE THROAT-A-AH

## 2018-07-09 ENCOUNTER — Ambulatory Visit: Payer: No Typology Code available for payment source | Admitting: Family Medicine

## 2018-07-20 ENCOUNTER — Other Ambulatory Visit: Payer: Self-pay

## 2018-07-20 ENCOUNTER — Encounter: Payer: Self-pay | Admitting: Physician Assistant

## 2018-07-20 ENCOUNTER — Ambulatory Visit: Payer: Self-pay | Admitting: Physician Assistant

## 2018-07-20 VITALS — BP 120/75 | HR 86 | Temp 98.2°F | Resp 16 | Wt 225.6 lb

## 2018-07-20 DIAGNOSIS — T63421A Toxic effect of venom of ants, accidental (unintentional), initial encounter: Secondary | ICD-10-CM

## 2018-07-20 MED ORDER — TRIAMCINOLONE ACETONIDE 0.1 % EX CREA: 1.0000 "application " | TOPICAL_CREAM | Freq: Two times a day (BID) | CUTANEOUS | 0 refills | Status: DC

## 2018-07-20 MED ORDER — PREDNISONE 20 MG PO TABS: 40.0000 mg | ORAL_TABLET | Freq: Every day | ORAL | 0 refills | Status: AC

## 2018-07-20 MED FILL — predniSONE 20 MG TABS: 20 | 5 days supply | Qty: 10 | Fill #0

## 2018-07-20 MED FILL — TRIAMCINOLONE 0.1% CREAM: 0.1 | 10 days supply | Qty: 30 | Fill #0

## 2018-07-20 NOTE — Progress Notes (Signed)
VITOR OVERBAUGH  MRN: [id number] DOB: 04-19-1990  Subjective:  Raymond Knapp is a 28 y.o. male seen in office today for a chief complaint of fire ant bites to left foot 2 days ago. Notes he was outside watching fireworks when he stepped in an ant hill. Was wearing flip flops. Immediately had multiple bites with associated burning sensation to left foot. He knocked the ants off.  He then broke out in diffuse hives. Took benadryl and within 30 minutes, hives resolved and have not returned.  Denies any SOB, difficulty breathing, trouble swallowing, lip swelling, tongue swelling, eye swelling, voice change, palpitations, chest pain, diaphoresis, abdominal pain, nausea, and vomiting. Today, notes the bite lesions have turned into pustules. Has surrounding swelling, redness, itching, and pain. Has avoided scratching the lesions. Denies fever, warmth, numbness, tingling, gait abnormality, diaphoresis and chills. Has not tried anything for relied, Denies smoking. Denies PMH of DM, HTN, heart disease, kidney disease, HIV, and autoimmune disease. Has never had an allergic reaction that he is aware of. Has never been stung by fire ants, bees, hornets, yellow jackets, or wasps before. Does not have an allergist. Denies any new exposure to soaps, lotions, laundry detergents, foods, medications, plants, or animals.   Review of Systems Per HPI  Patient Active Problem List   Diagnosis Date Noted  . Inguinal pain 11/14/2016    Current Outpatient Medications on File Prior to Visit  Medication Sig Dispense Refill  . HYDROcodone-acetaminophen (NORCO) 5-325 MG tablet Take 1 tablet by mouth every 4 (four) hours as needed (for pain; may cause constipation). (Patient not taking: Reported on 07/20/2018) 6 tablet 0  . Lidocaine, Anorectal, 5 % CREA Apply to perianal area as needed for pain. (Patient not taking: Reported on 07/20/2018) 1 Tube 0   No current facility-administered medications on file prior to visit.     No  Known Allergies   Objective:  BP 120/75 (BP Location: Right Arm, Patient Position: Sitting, Cuff Size: Normal)   Pulse 86   Temp 98.2 F (36.8 C) (Oral)   Resp 16   Wt 225 lb 9.6 oz (102.3 kg)   SpO2 97%   BMI 28.20 kg/m   Physical Exam Vitals signs reviewed.  Constitutional:      General: He is not in acute distress.    Appearance: He is well-developed. He is not ill-appearing or toxic-appearing.  HENT:     Head: Normocephalic and atraumatic.     Mouth/Throat:     Lips: Pink.     Mouth: Mucous membranes are moist. No oral lesions or angioedema.     Pharynx: Oropharynx is clear. Uvula midline.     Tonsils: No tonsillar exudate.  Eyes:     Extraocular Movements: Extraocular movements intact.     Conjunctiva/sclera: Conjunctivae normal.     Pupils: Pupils are equal, round, and reactive to light.  Neck:     Musculoskeletal: Normal range of motion.     Trachea: Phonation normal.  Cardiovascular:     Rate and Rhythm: Normal rate and regular rhythm.     Pulses:          Dorsalis pedis pulses are 2+ on the right side and 2+ on the left side.       Posterior tibial pulses are 2+ on the right side and 2+ on the left side.     Heart sounds: Normal heart sounds.  Pulmonary:     Effort: Pulmonary effort is normal.     Breath  sounds: Normal breath sounds. No decreased breath sounds, wheezing, rhonchi or rales.  Musculoskeletal:     Right lower leg: He exhibits no swelling.     Left lower leg: He exhibits no swelling.  Skin:    General: Skin is warm and dry.     Capillary Refill: Capillary refill takes less than 2 seconds.       Neurological:     Mental Status: He is alert and oriented to person, place, and time.          Assessment and Plan :  1. Fire ant bite, accidental or unintentional, initial encounter Pt is overall well appearing, NAD. VSS. No angioedema noted. Heart RRR. Lungs CTAB.  According to pt's hx, it sounds as if he did have an immediate systemic  allergic reaction to the fire ant bites. The diffuse hives did resolve with use of benadryl and are no longer present. He had no other signs/symptoms of anaphylaxis. The appearance of the affected foot today are consistent with sterile pustules due to localized reaction to fire ant bites. No concern for overlying secondary infection at this time. There is moderate swelling. No diffuse erythema, streaking or warmth noted. No excoriations overlying lesions. Sensation and strength intact. Would suggest short course oral prednisone and topical corticosteroids at this time. May use zyrtec and benadryl as prescribed if needed. Educated on potential signs of secondary infection to be aware of. Strongly advised pt to contact his PCP-would likely benefit from referral to allergy specialist for testing to evaluate his need for epipen for future recurrences. Pt agrees to do so as we cannot make referrals. Advised to follow up with PCP if sx not improving with current tx plan. Educated to seek care sooner if sx worsen/develop new concerning sx.  - predniSONE (DELTASONE) 20 MG tablet; Take 2 tablets (40 mg total) by mouth daily with breakfast for 5 days.  Dispense: 10 tablet; Refill: 0 - triamcinolone cream (KENALOG) 0.1 %; Apply 1 application topically 2 (two) times daily.  Dispense: 30 g; Refill: 0  Benjiman CoreBrittany Bethan Adamek, Cordelia Poche-C  [facility] 07/20/2018 12:57 PM

## 2018-07-20 NOTE — Patient Instructions (Signed)
Insect Bite, Adult  We are going to treat your underlying inflammation with oral prednisone. Prednisone is a steroid and can cause side effects such as headache, irritability, nausea, vomiting, increased heart rate, increased blood pressure, increased blood sugar, appetite changes, and insomnia. Please take tablets in the morning with a full meal to help decrease the chances of these side effects.   You can also use topical cream if the bites start to itch more. I would also recommend zyrtec daily and benadryl at night time.   If your swelling or redness becomes worse, or you develop any new concerning signs like fever, chills, nausea, vomiting, chills, seek care immediately at urgent care or ED.   I also recommend that you go to an allergy specialist for testing to determine if you need an epipen. In the meantime, carry over the counter benadryl and pepcid with you while doing activities outside. If you ever get bitten by fire ants and have similar reaction, take medication as directed on boxes immediatly.   An insect bite can make your skin red, itchy, and swollen. Some insects can spread disease to people with a bite. However, most insect bites do not lead to disease, and most are not serious. What are the causes? Insects may bite for many reasons, including:  Hunger.  To defend themselves. Insects that bite include:  Spiders.  Mosquitoes.  Ticks.  Fleas.  Ants.  Flies.  Kissing bugs.  Chiggers. What are the signs or symptoms? Symptoms of this condition include:  Itching or pain in the bite area.  Redness and swelling in the bite area.  An open wound (skin ulcer). Symptoms often last for 2-4 days. In rare cases, a person may have a very bad allergic reaction (anaphylactic reaction) to a bite. Symptoms of an anaphylactic reaction may include:  Feeling warm in the face (flushed). Your face may turn red.  Itchy, red, swollen areas of skin (hives).  Swelling of the: ?  Eyes. ? Lips. ? Face. ? Mouth. ? Tongue. ? Throat.  Trouble with any of these: ? Breathing. ? Talking. ? Swallowing.  Loud breathing (wheezing).  Feeling dizzy or light-headed.  Passing out (fainting).  Pain or cramps in your belly.  Throwing up (vomiting).  Watery poop (diarrhea). How is this treated? Treatment is usually not needed. Symptoms often go away on their own. When treatment is needed, it may involve:  Putting a cream or lotion on the bite area. This helps with itching.  Taking an antibiotic medicine. This treatment is needed if the bite area gets infected.  Getting a tetanus shot, if you are not up to date on this vaccine.  Putting ice on the affected area.  Using medicines called antihistamines. This treatment may be needed if you have itching or an allergic reaction to the insect bite.  Giving yourself a shot of medicine (epinephrine) using an auto-injector "pen" if you have an anaphylactic reaction to a bite. Your doctor will teach you how to use this pen. Follow these instructions at home: Bite area care   Do not scratch the bite area.  Keep the bite area clean and dry.  Wash the bite area every day with soap and water as told by your doctor.  Check the bite area every day for signs of infection. Check for: ? Redness, swelling, or pain. ? Fluid or blood. ? Warmth. ? Pus or a bad smell. Managing pain, itching, and swelling   You may put any of these on  the bite area as told by your doctor: ? A paste made of baking soda and water. ? Cortisone cream. ? Calamine lotion.  If told, put ice on the bite area. ? Put ice in a plastic bag. ? Place a towel between your skin and the bag. ? Leave the ice on for 20 minutes, 2-3 times a day. General instructions  Apply or take over-the-counter and prescription medicines only as told by your doctor.  If you were prescribed an antibiotic medicine, take or apply it as told by your doctor. Do not stop  using the antibiotic even if your condition improves.  Keep all follow-up visits as told by your doctor. This is important. How is this prevented? To help you have a lower risk of insect bites:  When you are outside, wear clothing that covers your arms and legs.  Use insect repellent. The best insect repellents contain one of these: ? DEET. ? Picaridin. ? Oil of lemon eucalyptus (OLE). ? IR3535.  Consider spraying your clothing with a pesticide called permethrin. Permethrin helps prevent insect bites. It works for several weeks and for up to 5-6 clothing washes. Do not apply permethrin directly to the skin.  If your home windows do not have screens, think about putting some in.  If you will be sleeping in an area where there are mosquitoes, consider covering your sleeping area with a mosquito net. Contact a doctor if:  You have redness, swelling, or pain in the bite area.  You have fluid or blood coming from the bite area.  The bite area feels warm to the touch.  You have pus or a bad smell coming from the bite area.  You have a fever. Get help right away if:  You have joint pain.  You have a rash.  You feel more tired or sleepy than you normally do.  You have neck pain.  You have a headache.  You feel weaker than you normally do.  You have signs of an anaphylactic reaction. Signs may include: ? Feeling warm in the face. ? Itchy, red, swollen areas of skin. ? Swelling of your:  Eyes.  Lips.  Face.  Mouth.  Tongue.  Throat. ? Trouble with any of these:  Breathing.  Talking.  Swallowing. ? Loud breathing. ? Feeling dizzy or light-headed. ? Passing out. ? Pain or cramps in your belly. ? Throwing up. ? Watery poop. These symptoms may be an emergency. Do not wait to see if the symptoms will go away. Do this right away:  Use your auto-injector pen as you have been told.  Get medical help. Call your local emergency services (911 in the [place] Do  not drive yourself to the hospital. Summary  An insect bite can make your skin red, itchy, and swollen.  Treatment is usually not needed. Symptoms often go away on their own.  Do not scratch the bite area. Keep it clean and dry.  Ice can help with pain and itching from the bite. This information is not intended to replace advice given to you by your health care provider. Make sure you discuss any questions you have with your health care provider. Document Released: 12/29/1999 Document Revised: 07/11/2017 Document Reviewed: 07/11/2017 Elsevier Patient Education  2020 [employer]

## 2018-07-22 ENCOUNTER — Telehealth: Payer: Self-pay

## 2018-07-22 NOTE — Telephone Encounter (Signed)
Patient did not answered the phone, I left a message asking to call us back.  

## 2018-08-19 ENCOUNTER — Other Ambulatory Visit: Payer: Self-pay

## 2018-08-19 ENCOUNTER — Other Ambulatory Visit: Payer: Self-pay | Admitting: Nurse Practitioner

## 2018-08-19 ENCOUNTER — Ambulatory Visit
Admission: RE | Admit: 2018-08-19 | Discharge: 2018-08-19 | Disposition: A | Discharge status: Home / Self Care | Payer: No Typology Code available for payment source | Source: Ambulatory Visit | Attending: Nurse Practitioner | Admitting: Nurse Practitioner

## 2018-08-19 DIAGNOSIS — Z021 Encounter for pre-employment examination: Secondary | ICD-10-CM

## 2018-11-10 ENCOUNTER — Ambulatory Visit: Payer: No Typology Code available for payment source | Admitting: Emergency Medicine

## 2018-11-11 ENCOUNTER — Other Ambulatory Visit: Payer: Self-pay

## 2018-11-11 ENCOUNTER — Encounter: Payer: Self-pay | Admitting: Emergency Medicine

## 2018-11-11 ENCOUNTER — Ambulatory Visit (INDEPENDENT_AMBULATORY_CARE_PROVIDER_SITE_OTHER): Payer: Self-pay | Admitting: Emergency Medicine

## 2018-11-11 VITALS — BP 120/76 | HR 77 | Temp 98.1°F | Resp 16 | Ht 75.0 in | Wt 219.6 lb

## 2018-11-11 DIAGNOSIS — K610 Anal abscess: Secondary | ICD-10-CM

## 2018-11-11 MED ORDER — CEFADROXIL 500 MG PO CAPS: 500.0000 mg | ORAL_CAPSULE | Freq: Two times a day (BID) | ORAL | 0 refills | Status: AC

## 2018-11-11 NOTE — Patient Instructions (Addendum)
If you have lab work done today you will be contacted with your lab results within the next 2 weeks.  If you have not heard from [place] then please contact us. The fastest way to get your results is to register for My Chart.   IF you received an x-ray today, you will receive an invoice from [facility]. Please contact [facility] at [phone] with questions or concerns regarding your invoice.   IF you received labwork today, you will receive an invoice from [facility]. Please contact [facility] at [phone] with questions or concerns regarding your invoice.   Our billing staff will not be able to assist you with questions regarding bills from these companies.  You will be contacted with the lab results as soon as they are available. The fastest way to get your results is to activate your My Chart account. Instructions are located on the last page of this paperwork. If you have not heard from [place] regarding the results in 2 weeks, please contact this office.     Anorectal Abscess An abscess is an infected area that contains a collection of pus. An anorectal abscess is an abscess that is near the opening of the anus or around the rectum. Without treatment, an anorectal abscess can become larger and cause other problems, such as a more serious body-wide infection or pain, especially during bowel movements. What are the causes? This condition is caused by plugged glands or an infection in one of these areas:  The anus.  The area between the anus and the scrotum in males or between the anus and the vagina in females (perineum). What increases the risk? The following factors may make you more likely to develop this condition:  Diabetes or inflammatory bowel disease.  Having a body defense system (immune system) that is weak.  Engaging in anal sex.  Having a sexually transmitted infection (STI).  Certain kinds of cancer, such as rectal carcinoma, leukemia, or  lymphoma. What are the signs or symptoms? The main symptom of this condition is pain. The pain may be a throbbing pain that gets worse during bowel movements. Other symptoms include:  Swelling and redness in the area of the abscess. The redness may go beyond the abscess and appear as a red streak on the skin.  A visible, painful lump, or a lump that can be felt when touched.  Bleeding or pus-like discharge from the area.  Fever.  General weakness.  Constipation.  Diarrhea. How is this diagnosed? This condition is diagnosed based on your medical history and a physical exam of the affected area.  This may involve examining the rectal area with a gloved hand (digital rectal exam).  Sometimes, the health care provider needs to look into the rectum using a probe, scope, or imaging test.  For women, it may require a careful vaginal exam. How is this treated? Treatment for this condition may include:  Incision and drainage surgery. This involves making an incision over the abscess to drain the pus.  Medicines, including antibiotic medicine, pain medicine, stool softeners, or laxatives. Follow these instructions at home: Medicines  Take over-the-counter and prescription medicines only as told by your health care provider.  If you were prescribed an antibiotic medicine, use it as told by your health care provider. Do not stop using the antibiotic even if you start to feel better.  Do not drive or use heavy machinery while taking prescription pain medicine. Wound care   If gauze was  used in the abscess, follow instructions from your health care provider about removing or changing the gauze. It can usually be removed in 2-3 days.  Wash your hands with soap and water before you remove or change your gauze. If soap and water are not available, use hand sanitizer.  If one or more drains were placed in the abscess cavity, be careful not to pull at them. Your health care provider will  tell you how long they need to remain in place.  Check your incision area every day for signs of infection. Check for: ? More redness, swelling, or pain. ? More fluid or blood. ? Warmth. ? Pus or a bad smell. Managing pain, stiffness, and swelling   Take a sitz bath 3-4 times a day and after bowel movements. This will help reduce pain and swelling.  To relieve pain, try sitting: ? On a heating pad with the setting on low. ? On an inflatable donut-shaped cushion.  If directed, put ice on the affected area: ? Put ice in a plastic bag. ? Place a towel between your skin and the bag. ? Leave the ice on for 20 minutes, 2-3 times a day. General instructions  Follow any diet instructions given by your health care provider.  Keep all follow-up visits as told by your health care provider. This is important. Contact a health care provider if you have:  Bleeding from your incision.  Pain, swelling, or redness that does not improve or gets worse.  Trouble passing stool or urine.  Symptoms that return after treatment. Get help right away if you:  Have problems moving or using your legs.  Have severe or increasing pain.  Have swelling in the affected area that suddenly gets worse.  Have a large increase in bleeding or passing of pus.  Develop chills or a fever. Summary  An anorectal abscess is an abscess that is near the opening of the anus or around the rectum. An abscess is an infected area that contains a collection of pus.  The main symptom of this condition is pain. It may be a throbbing pain that gets worse during bowel movements.  Treatment for an anorectal abscess may include surgery to drain the pus from the abscess. Medicines and sitz baths may also be a part of your treatment plan. This information is not intended to replace advice given to you by your health care provider. Make sure you discuss any questions you have with your health care provider. Document Released:  12/29/1999 Document Revised: 02/06/2017 Document Reviewed: 02/06/2017 Elsevier Patient Education  2020 [employer].

## 2018-11-11 NOTE — Progress Notes (Signed)
Raymond Knapp 28 y.o.   Chief Complaint  Patient presents with  . Rectal Pain    abcess on left side buttock for 5 days    HISTORY OF PRESENT ILLNESS: This is a 28 y.o. male complaining of abscess to left buttock area which drained over the weekend purulent and serosanguineous material.  Still a bit sore but much better than several days ago.  No other complaints or medical concerns today.  HPI   Prior to Admission medications   Medication Sig Start Date End Date Taking? Authorizing Provider  HYDROcodone-acetaminophen (NORCO) 5-325 MG tablet Take 1 tablet by mouth every 4 (four) hours as needed (for pain; may cause constipation). Patient not taking: Reported on 07/20/2018 01/08/18   Molpus, John, MD  Lidocaine, Anorectal, 5 % CREA Apply to perianal area as needed for pain. Patient not taking: Reported on 07/20/2018 01/08/18   Molpus, John, MD  triamcinolone cream (KENALOG) 0.1 % Apply 1 application topically 2 (two) times daily. Patient not taking: Reported on 11/11/2018 07/20/18   Benjiman CoreWiseman, Brittany D, PA-C    Allergies  Allergen Reactions  . Other     Patient Active Problem List   Diagnosis Date Noted  . Inguinal pain 11/14/2016    No past medical history on file.  No past surgical history on file.  Social History   Socioeconomic History  . Marital status: Single    Spouse name: Not on file  . Number of children: Not on file  . Years of education: Not on file  . Highest education level: Not on file  Occupational History  . Not on file  Social Needs  . Financial resource strain: Not on file  . Food insecurity    Worry: Not on file    Inability: Not on file  . Transportation needs    Medical: Not on file    Non-medical: Not on file  Tobacco Use  . Smoking status: Never Smoker  . Smokeless tobacco: Never Used  Substance and Sexual Activity  . Alcohol use: No  . Drug use: No  . Sexual activity: Yes  Lifestyle  . Physical activity    Days per week: Not on file     Minutes per session: Not on file  . Stress: Not on file  Relationships  . Social Musicianconnections    Talks on phone: Not on file    Gets together: Not on file    Attends religious service: Not on file    Active member of club or organization: Not on file    Attends meetings of clubs or organizations: Not on file    Relationship status: Not on file  . Intimate partner violence    Fear of current or ex partner: Not on file    Emotionally abused: Not on file    Physically abused: Not on file    Forced sexual activity: Not on file  Other Topics Concern  . Not on file  Social History Narrative  . Not on file    No family history on file.   Review of Systems  Constitutional: Negative.  Negative for chills and fever.  HENT: Negative.  Negative for congestion and sore throat.   Respiratory: Negative.  Negative for cough and shortness of breath.   Cardiovascular: Negative.  Negative for chest pain and palpitations.  Gastrointestinal: Negative for abdominal pain, diarrhea, nausea and vomiting.  Neurological: Negative for dizziness and headaches.  All other systems reviewed and are negative.  Today's Vitals  11/11/18 1617  BP: 120/76  Pulse: 77  Resp: 16  Temp: 98.1 F (36.7 C)  TempSrc: Oral  SpO2: 97%  Weight: 219 lb 9.6 oz (99.6 kg)  Height: 6\' 3"  (1.905 m)   Body mass index is 27.45 kg/m.   Physical Exam Vitals signs reviewed.  Constitutional:      Appearance: Normal appearance.  HENT:     Head: Normocephalic.  Eyes:     Extraocular Movements: Extraocular movements intact.     Pupils: Pupils are equal, round, and reactive to light.  Neck:     Musculoskeletal: Normal range of motion.  Cardiovascular:     Rate and Rhythm: Normal rate and regular rhythm.  Pulmonary:     Effort: Pulmonary effort is normal.  Musculoskeletal: Normal range of motion.  Skin:    General: Skin is warm and dry.     Capillary Refill: Capillary refill takes less than 2 seconds.      Comments: Left perianal area: There is small area of erythema where the abscess spontaneously drained.  No fluctuation.  No swelling or tenderness in the area.  Neurological:     General: No focal deficit present.     Mental Status: He is alert and oriented to person, place, and time.      ASSESSMENT & PLAN: [name] was seen today for rectal pain.  Diagnoses and all orders for this visit:  Perianal abscess Comments: Spontaneously drained Orders: -     cefadroxil (DURICEF) 500 MG capsule; Take 1 capsule (500 mg total) by mouth 2 (two) times daily for 7 days.    Patient Instructions       If you have lab work done today you will be contacted with your lab results within the next 2 weeks.  If you have not heard from Lars Mage then please contact us. The fastest way to get your results is to register for My Chart.   IF you received an x-ray today, you will receive an invoice from [facility]. Please contact [facility] at [phone] with questions or concerns regarding your invoice.   IF you received labwork today, you will receive an invoice from [place]. Please contact [facility] at [phone] with questions or concerns regarding your invoice.   Our billing staff will not be able to assist you with questions regarding bills from these companies.  You will be contacted with the lab results as soon as they are available. The fastest way to get your results is to activate your My Chart account. Instructions are located on the last page of this paperwork. If you have not heard from [phone] regarding the results in 2 weeks, please contact this office.     Anorectal Abscess An abscess is an infected area that contains a collection of pus. An anorectal abscess is an abscess that is near the opening of the anus or around the rectum. Without treatment, an anorectal abscess can become larger and cause other problems, such as a more serious body-wide infection or pain, especially  during bowel movements. What are the causes? This condition is caused by plugged glands or an infection in one of these areas:  The anus.  The area between the anus and the scrotum in males or between the anus and the vagina in females (perineum). What increases the risk? The following factors may make you more likely to develop this condition:  Diabetes or inflammatory bowel disease.  Having a body defense system (immune system) that is weak.  Engaging in  anal sex.  Having a sexually transmitted infection (STI).  Certain kinds of cancer, such as rectal carcinoma, leukemia, or lymphoma. What are the signs or symptoms? The main symptom of this condition is pain. The pain may be a throbbing pain that gets worse during bowel movements. Other symptoms include:  Swelling and redness in the area of the abscess. The redness may go beyond the abscess and appear as a red streak on the skin.  A visible, painful lump, or a lump that can be felt when touched.  Bleeding or pus-like discharge from the area.  Fever.  General weakness.  Constipation.  Diarrhea. How is this diagnosed? This condition is diagnosed based on your medical history and a physical exam of the affected area.  This may involve examining the rectal area with a gloved hand (digital rectal exam).  Sometimes, the health care provider needs to look into the rectum using a probe, scope, or imaging test.  For women, it may require a careful vaginal exam. How is this treated? Treatment for this condition may include:  Incision and drainage surgery. This involves making an incision over the abscess to drain the pus.  Medicines, including antibiotic medicine, pain medicine, stool softeners, or laxatives. Follow these instructions at home: Medicines  Take over-the-counter and prescription medicines only as told by your health care provider.  If you were prescribed an antibiotic medicine, use it as told by your health  care provider. Do not stop using the antibiotic even if you start to feel better.  Do not drive or use heavy machinery while taking prescription pain medicine. Wound care   If gauze was used in the abscess, follow instructions from your health care provider about removing or changing the gauze. It can usually be removed in 2-3 days.  Wash your hands with soap and water before you remove or change your gauze. If soap and water are not available, use hand sanitizer.  If one or more drains were placed in the abscess cavity, be careful not to pull at them. Your health care provider will tell you how long they need to remain in place.  Check your incision area every day for signs of infection. Check for: ? More redness, swelling, or pain. ? More fluid or blood. ? Warmth. ? Pus or a bad smell. Managing pain, stiffness, and swelling   Take a sitz bath 3-4 times a day and after bowel movements. This will help reduce pain and swelling.  To relieve pain, try sitting: ? On a heating pad with the setting on low. ? On an inflatable donut-shaped cushion.  If directed, put ice on the affected area: ? Put ice in a plastic bag. ? Place a towel between your skin and the bag. ? Leave the ice on for 20 minutes, 2-3 times a day. General instructions  Follow any diet instructions given by your health care provider.  Keep all follow-up visits as told by your health care provider. This is important. Contact a health care provider if you have:  Bleeding from your incision.  Pain, swelling, or redness that does not improve or gets worse.  Trouble passing stool or urine.  Symptoms that return after treatment. Get help right away if you:  Have problems moving or using your legs.  Have severe or increasing pain.  Have swelling in the affected area that suddenly gets worse.  Have a large increase in bleeding or passing of pus.  Develop chills or a fever. Summary  An  anorectal abscess is an  abscess that is near the opening of the anus or around the rectum. An abscess is an infected area that contains a collection of pus.  The main symptom of this condition is pain. It may be a throbbing pain that gets worse during bowel movements.  Treatment for an anorectal abscess may include surgery to drain the pus from the abscess. Medicines and sitz baths may also be a part of your treatment plan. This information is not intended to replace advice given to you by your health care provider. Make sure you discuss any questions you have with your health care provider. Document Released: 12/29/1999 Document Revised: 02/06/2017 Document Reviewed: 02/06/2017 Elsevier Patient Education  2020 Elsevier Inc.      Edwina Barth, MD [facility]

## 2018-12-28 ENCOUNTER — Other Ambulatory Visit: Payer: Self-pay

## 2018-12-28 ENCOUNTER — Ambulatory Visit: Payer: 59 | Admitting: Emergency Medicine

## 2018-12-28 ENCOUNTER — Encounter: Payer: Self-pay | Admitting: Emergency Medicine

## 2018-12-28 VITALS — BP 135/89 | HR 84 | Temp 98.3°F | Ht 75.0 in | Wt 216.2 lb

## 2018-12-28 DIAGNOSIS — J3081 Allergic rhinitis due to animal (cat) (dog) hair and dander: Secondary | ICD-10-CM | POA: Diagnosis not present

## 2018-12-28 DIAGNOSIS — J9809 Other diseases of bronchus, not elsewhere classified: Secondary | ICD-10-CM

## 2018-12-28 DIAGNOSIS — Z9109 Other allergy status, other than to drugs and biological substances: Secondary | ICD-10-CM

## 2018-12-28 MED ORDER — ALBUTEROL SULFATE HFA 108 (90 BASE) MCG/ACT IN AERS: 2.0000 | INHALATION_SPRAY | Freq: Four times a day (QID) | RESPIRATORY_TRACT | 3 refills | Status: AC | PRN

## 2018-12-28 MED ORDER — CETIRIZINE HCL 10 MG PO TABS: 10.0000 mg | ORAL_TABLET | Freq: Every day | ORAL | 1 refills | Status: AC

## 2018-12-28 MED FILL — VENTOLIN HFA 90 MCG INHALER: 108 (90 BAS | 25 days supply | Qty: 18 | Fill #0

## 2018-12-28 NOTE — Patient Instructions (Addendum)
If you have lab work done today you will be contacted with your lab results within the next 2 weeks.  If you have not heard from [place] then please contact us. The fastest way to get your results is to register for My Chart.   IF you received an x-ray today, you will receive an invoice from [facility]. Please contact [facility] at [phone] with questions or concerns regarding your invoice.   IF you received labwork today, you will receive an invoice from [place]. Please contact [facility] at [phone] with questions or concerns regarding your invoice.   Our billing staff will not be able to assist you with questions regarding bills from these companies.  You will be contacted with the lab results as soon as they are available. The fastest way to get your results is to activate your My Chart account. Instructions are located on the last page of this paperwork. If you have not heard from [place] regarding the results in 2 weeks, please contact this office.      Bronchospasm, Adult  Bronchospasm is when airways in the lungs get smaller. When this happens, it can be hard to breathe. You may cough. You may also make a whistling sound when you breathe (wheeze). Follow these instructions at home: Medicines  Take over-the-counter and prescription medicines only as told by your doctor.  If you need to use an inhaler or nebulizer to take your medicine, ask your doctor how to use it.  If you were given a spacer, always use it with your inhaler. Lifestyle  Change your heating and air conditioning filter. Do this at least once a month.  Try not to use fireplaces and wood stoves.  Do not  smoke. Do not  allow smoking in your home.  Try not to use things that have a strong smell, like perfume.  Get rid of pests (such as roaches and mice) and their poop.  Remove any mold from your home.  Keep your house clean. Get rid of dust.  Use cleaning products that have no  smell.  Replace carpet with wood, tile, or vinyl flooring.  Use allergy-proof pillows, mattress covers, and box spring covers.  Wash bed sheets and blankets every week. Use hot water. Dry them in a dryer.  Use blankets that are made of polyester or cotton.  Wash your hands often.  Keep pets out of your bedroom.  When you exercise, try not to breathe in cold air. General instructions  Have a plan for getting medical care. Know these things: ? When to call your doctor. ? When to call local emergency services (911 in the [place] ? Where to go in an emergency.  Stay up to date on your shots (immunizations).  When you have an episode: ? Stay calm. ? Relax. ? Breathe slowly. Contact a doctor if:  Your muscles ache.  Your chest hurts.  The color of the mucus you cough up (sputum) changes from clear or white to yellow, green, gray, or bloody.  The mucus you cough up gets thicker.  You have a fever. Get help right away if:  The whistling sound gets worse, even after you take your medicines.  Your coughing gets worse.  You find it even harder to breathe.  Your chest hurts very much. Summary  Bronchospasm is when airways in the lungs get smaller.  When this happens, it can be hard to breathe. You may cough. You may also make a whistling sound  when you breathe.  Stay away from things that cause you to have episodes. These include smoke or dust. This information is not intended to replace advice given to you by your health care provider. Make sure you discuss any questions you have with your health care provider. Document Released: 10/28/2008 Document Revised: 12/13/2016 Document Reviewed: 01/04/2016 Elsevier Patient Education  2020 [employer].

## 2018-12-28 NOTE — Progress Notes (Signed)
Raymond Knapp 28 y.o.   Chief Complaint  Patient presents with  . Allergies    around animals and was having trouble breathing. (request an inhaler PRN)  . Allergies referral    HISTORY OF PRESENT ILLNESS: This is a 28 y.o. male complaining of wheezing and difficulty breathing mostly around animals.  No other significant symptoms.  No history of asthma.  No other complaints or medical concerns today.  HPI   Prior to Admission medications   Medication Sig Start Date End Date Taking? Authorizing Provider  albuterol (VENTOLIN HFA) 108 (90 Base) MCG/ACT inhaler Inhale 2 puffs into the lungs every 6 (six) hours as needed for wheezing or shortness of breath. 12/28/18   Horald Pollen, MD    Allergies  Allergen Reactions  . Other     There are no problems to display for this patient.   History reviewed. No pertinent past medical history.  History reviewed. No pertinent surgical history.  Social History   Socioeconomic History  . Marital status: Single    Spouse name: Not on file  . Number of children: Not on file  . Years of education: Not on file  . Highest education level: Not on file  Occupational History  . Not on file  Tobacco Use  . Smoking status: Never Smoker  . Smokeless tobacco: Never Used  Substance and Sexual Activity  . Alcohol use: No  . Drug use: No  . Sexual activity: Yes  Other Topics Concern  . Not on file  Social History Narrative  . Not on file   Social Determinants of Health   Financial Resource Strain:   . Difficulty of Paying Living Expenses: Not on file  Food Insecurity:   . Worried About Charity fundraiser in the Last Year: Not on file  . Ran Out of Food in the Last Year: Not on file  Transportation Needs:   . Lack of Transportation (Medical): Not on file  . Lack of Transportation (Non-Medical): Not on file  Physical Activity:   . Days of Exercise per Week: Not on file  . Minutes of Exercise per Session: Not on file    Stress:   . Feeling of Stress : Not on file  Social Connections:   . Frequency of Communication with Friends and Family: Not on file  . Frequency of Social Gatherings with Friends and Family: Not on file  . Attends Religious Services: Not on file  . Active Member of Clubs or Organizations: Not on file  . Attends Archivist Meetings: Not on file  . Marital Status: Not on file  Intimate Partner Violence:   . Fear of Current or Ex-Partner: Not on file  . Emotionally Abused: Not on file  . Physically Abused: Not on file  . Sexually Abused: Not on file    History reviewed. No pertinent family history.   Review of Systems  Constitutional: Negative.  Negative for chills and fever.  HENT: Negative.  Negative for congestion and sore throat.   Eyes: Negative.   Respiratory: Positive for shortness of breath and wheezing.   Cardiovascular: Negative.  Negative for chest pain and palpitations.  Gastrointestinal: Negative.  Negative for abdominal pain, nausea and vomiting.  Musculoskeletal: Negative.  Negative for myalgias.  Skin: Negative.  Negative for rash.  Neurological: Negative for dizziness and headaches.  Endo/Heme/Allergies: Positive for environmental allergies.  All other systems reviewed and are negative.   Today's Vitals   12/28/18 1638  BP:  135/89  Pulse: 84  Temp: 98.3 F (36.8 C)  TempSrc: Oral  SpO2: 97%  Weight: 216 lb 3.2 oz (98.1 kg)  Height: 6\' 3"  (1.905 m)   Body mass index is 27.02 kg/m.  Physical Exam Vitals reviewed.  Constitutional:      Appearance: Normal appearance.  HENT:     Head: Normocephalic.  Eyes:     Extraocular Movements: Extraocular movements intact.     Pupils: Pupils are equal, round, and reactive to light.  Cardiovascular:     Rate and Rhythm: Normal rate and regular rhythm.  Pulmonary:     Effort: Pulmonary effort is normal.     Breath sounds: Normal breath sounds.  Musculoskeletal:        General: Normal range of  motion.     Cervical back: Normal range of motion and neck supple.  Skin:    General: Skin is warm and dry.     Capillary Refill: Capillary refill takes less than 2 seconds.  Neurological:     General: No focal deficit present.     Mental Status: He is alert and oriented to person, place, and time.  Psychiatric:        Mood and Affect: Mood normal.        Behavior: Behavior normal.      ASSESSMENT & PLAN: Raymond Knapp was seen today for allergies and allergies referral.  Diagnoses and all orders for this visit:  Recurrent bronchospasm -     albuterol (VENTOLIN HFA) 108 (90 Base) MCG/ACT inhaler; Inhale 2 puffs into the lungs every 6 (six) hours as needed for wheezing or shortness of breath. -     Ambulatory referral to Allergy -     cetirizine (ZYRTEC) 10 MG tablet; Take 1 tablet (10 mg total) by mouth daily.  Allergy to animals -     Ambulatory referral to Allergy -     cetirizine (ZYRTEC) 10 MG tablet; Take 1 tablet (10 mg total) by mouth daily.    Patient Instructions       If you have lab work done today you will be contacted with your lab results within the next 2 weeks.  If you have not heard from Raymond Knapp then please contact us. The fastest way to get your results is to register for My Chart.   IF you received an x-ray today, you will receive an invoice from [facility]. Please contact [facility] at [phone] with questions or concerns regarding your invoice.   IF you received labwork today, you will receive an invoice from [place]. Please contact [facility] at [phone] with questions or concerns regarding your invoice.   Our billing staff will not be able to assist you with questions regarding bills from these companies.  You will be contacted with the lab results as soon as they are available. The fastest way to get your results is to activate your My Chart account. Instructions are located on the last page of this paperwork. If you have not heard  from [phone] regarding the results in 2 weeks, please contact this office.      Bronchospasm, Adult  Bronchospasm is when airways in the lungs get smaller. When this happens, it can be hard to breathe. You may cough. You may also make a whistling sound when you breathe (wheeze). Follow these instructions at home: Medicines  Take over-the-counter and prescription medicines only as told by your doctor.  If you need to use an inhaler or nebulizer to take your medicine,  ask your doctor how to use it.  If you were given a spacer, always use it with your inhaler. Lifestyle  Change your heating and air conditioning filter. Do this at least once a month.  Try not to use fireplaces and wood stoves.  Do not  smoke. Do not  allow smoking in your home.  Try not to use things that have a strong smell, like perfume.  Get rid of pests (such as roaches and mice) and their poop.  Remove any mold from your home.  Keep your house clean. Get rid of dust.  Use cleaning products that have no smell.  Replace carpet with wood, tile, or vinyl flooring.  Use allergy-proof pillows, mattress covers, and box spring covers.  Wash bed sheets and blankets every week. Use hot water. Dry them in a dryer.  Use blankets that are made of polyester or cotton.  Wash your hands often.  Keep pets out of your bedroom.  When you exercise, try not to breathe in cold air. General instructions  Have a plan for getting medical care. Know these things: ? When to call your doctor. ? When to call local emergency services (911 in the [place] ? Where to go in an emergency.  Stay up to date on your shots (immunizations).  When you have an episode: ? Stay calm. ? Relax. ? Breathe slowly. Contact a doctor if:  Your muscles ache.  Your chest hurts.  The color of the mucus you cough up (sputum) changes from clear or white to yellow, green, gray, or bloody.  The mucus you cough up gets thicker.  You have a  fever. Get help right away if:  The whistling sound gets worse, even after you take your medicines.  Your coughing gets worse.  You find it even harder to breathe.  Your chest hurts very much. Summary  Bronchospasm is when airways in the lungs get smaller.  When this happens, it can be hard to breathe. You may cough. You may also make a whistling sound when you breathe.  Stay away from things that cause you to have episodes. These include smoke or dust. This information is not intended to replace advice given to you by your health care provider. Make sure you discuss any questions you have with your health care provider. Document Released: 10/28/2008 Document Revised: 12/13/2016 Document Reviewed: 01/04/2016 Elsevier Patient Education  2020 Elsevier Inc.      Edwina BarthMiguel Ishmail Mcmanamon, MD [facility]

## 2018-12-29 ENCOUNTER — Ambulatory Visit: Payer: Self-pay | Admitting: Emergency Medicine

## 2019-05-31 ENCOUNTER — Ambulatory Visit: Payer: 59 | Admitting: Allergy

## 2019-06-02 ENCOUNTER — Ambulatory Visit: Payer: 59 | Admitting: Allergy

## 2019-06-02 ENCOUNTER — Encounter: Payer: Self-pay | Admitting: Allergy

## 2019-06-02 ENCOUNTER — Other Ambulatory Visit: Payer: Self-pay

## 2019-06-02 VITALS — BP 120/82 | HR 72 | Temp 98.1°F | Resp 18 | Ht 75.0 in | Wt 224.0 lb

## 2019-06-02 DIAGNOSIS — J3089 Other allergic rhinitis: Secondary | ICD-10-CM | POA: Diagnosis not present

## 2019-06-02 DIAGNOSIS — H1013 Acute atopic conjunctivitis, bilateral: Secondary | ICD-10-CM | POA: Diagnosis not present

## 2019-06-02 DIAGNOSIS — J4530 Mild persistent asthma, uncomplicated: Secondary | ICD-10-CM | POA: Diagnosis not present

## 2019-06-02 DIAGNOSIS — T63421D Toxic effect of venom of ants, accidental (unintentional), subsequent encounter: Secondary | ICD-10-CM

## 2019-06-02 DIAGNOSIS — T63421A Toxic effect of venom of ants, accidental (unintentional), initial encounter: Secondary | ICD-10-CM | POA: Insufficient documentation

## 2019-06-02 HISTORY — DX: Mild persistent asthma, uncomplicated: J45.30

## 2019-06-02 MED ORDER — ARNUITY ELLIPTA 100 MCG/ACT IN AEPB: 1.0000 | INHALATION_SPRAY | Freq: Every day | RESPIRATORY_TRACT | 5 refills | Status: AC

## 2019-06-02 MED FILL — ARNUITY ELLIPTA 100 MCG INH: 100 | 30 days supply | Qty: 30 | Fill #0

## 2019-06-02 NOTE — Assessment & Plan Note (Signed)
No previous diagnosis of asthma however noted some wheezing, chest tightness, shortness of breath and nocturnal awakenings for 1 year especially if exposed to pet or exertion in the cold weather.  Using albuterol depending on circumstances with good benefit.  Patient is a IT sales professional.  Today's spirometry showed mild obstruction with 14% improvement in FEV1 post bronchodilator treatment concerning for asthma. . Daily controller medication(s): Start Arnuity 100 1 puff daily and rinse mouth after each use.  . May use albuterol rescue inhaler 2 puffs every 4 to 6 hours as needed for shortness of breath, chest tightness, coughing, and wheezing. May use albuterol rescue inhaler 2 puffs 5 to 15 minutes prior to strenuous physical activities. Monitor frequency of use.  . Repeat spirometry at next visit.

## 2019-06-02 NOTE — Assessment & Plan Note (Signed)
   See assessment and plan as above for allergic rhinitis.  

## 2019-06-02 NOTE — Progress Notes (Signed)
New Patient Note  RE: Raymond Knapp MRN: [id number] DOB: 1990-01-31 Date of Office Visit: 06/02/2019  Referring provider: Georgina Knapp, * Primary care provider: Georgina Quint, MD  Chief Complaint: Allergies (wheezing, runny nose, itchy eyes. would like to know what he is allergic to and see if actual does need an inhaler or not. )  History of Present Illness: I had the pleasure of seeing Raymond Knapp for initial evaluation at the [facility] on 06/02/2019. He is a 29 y.o. male, who is referred here by Raymond Quint, MD for the evaluation of asthma and allergy to animals.  Respiratory:  He reports symptoms of chest tightness, shortness of breath, wheezing, nocturnal awakenings for 1 years. Current medications include albuterol prn which help. He reports not using aerochamber with inhalers. He tried the following inhalers: none. Main triggers are pet exposure, exercising in cold weather. In the last month, frequency of symptoms: 1x/week. Frequency of nocturnal symptoms: 0x/month. Frequency of SABA use: 1x/week. Interference with physical activity: no. Sleep is undisturbed. In the last 12 months, emergency room visits/urgent care visits/doctor office visits or hospitalizations due to respiratory issues: 0. In the last 12 months, oral steroids courses: 0. Lifetime history of hospitalization for respiratory issues: 0. Prior intubations: 0. History of pneumonia: no. He was not evaluated by allergist/pulmonologist in the past. Smoking exposure: no. Up to date with flu vaccine: yes. Up to date with COVID-19 vaccine: no.  History of reflux: no.  Rhinitis: He reports symptoms of rhinorrhea, sneezing, nasal congestion, itchy eyes. Symptoms have been going on for 20 years. The symptoms are present mainly around the pets. Other triggers include exposure to cats and dogs. Anosmia: no. Headache: no. He has used zyrtec with some improvement in symptoms. Sinus infections:  none. Previous work up includes: no. Previous ENT evaluation: no. Previous sinus imaging: no. History of nasal polyps: no. Last eye exam: 1 year ago.  Assessment and Plan: Raymond Knapp is a 28 y.o. male with: Mild persistent asthma without complication No previous diagnosis of asthma however noted some wheezing, chest tightness, shortness of breath and nocturnal awakenings for 1 year especially if exposed to pet or exertion in the cold weather.  Using albuterol depending on circumstances with good benefit.  Patient is a IT sales professional.  Today's spirometry showed mild obstruction with 14% improvement in FEV1 post bronchodilator treatment concerning for asthma. . Daily controller medication(s): Start Arnuity 100 1 puff daily and rinse mouth after each use.  . May use albuterol rescue inhaler 2 puffs every 4 to 6 hours as needed for shortness of breath, chest tightness, coughing, and wheezing. May use albuterol rescue inhaler 2 puffs 5 to 15 minutes prior to strenuous physical activities. Monitor frequency of use.  . Repeat spirometry at next visit.  Other allergic rhinitis Rhinoconjunctivitis symptoms for 20+ years mainly around cats and dogs.  Using Zyrtec as needed with some benefit.  No prior allergy evaluation.  Today's skin testing showed: Significant positive to grass, weed, ragweed, trees, mold, dust mites, cat, dog and cockroach. Borderline to casein, shellfish - okay to eat as before most likely irrelevant sensitization. Borderline to positive to fire ant.   Start environmental control measures as below.  Interestingly patient did not complain of daily rhinoconjunctivitis symptoms and will continue to use over the counter antihistamines such as Zyrtec (cetirizine), Claritin (loratadine), Allegra (fexofenadine), or Xyzal (levocetirizine) daily as needed.  Read about allergy injections - handout given.  If you have  worsening nasal or eye symptoms let us know and I can send in a prescription for  nasal spray and/or eye drops.  Allergic conjunctivitis of both eyes  See assessment and plan as above for allergic rhinitis.  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  Today's skin prick testing borderline positive to fire ant.  Try to avoid fire ants.  For mild symptoms you can take over the counter antihistamines such as Benadryl and monitor symptoms closely. If symptoms worsen or if you have severe symptoms including breathing issues, throat closure, significant swelling, whole body hives, severe diarrhea and vomiting, lightheadedness then seek immediate medical care.  Return in about 2 months (around 08/02/2019).  Meds ordered this encounter  Medications  . Fluticasone Furoate (ARNUITY ELLIPTA) 100 MCG/ACT AEPB    Sig: Inhale 1 puff into the lungs daily. Rinse mouth after each use.    Dispense:  30 each    Refill:  5   Other allergy screening: Food allergy: no Medication allergy: no Hymenoptera allergy: no Urticaria: yes with red ants Eczema:no History of recurrent infections suggestive of immunodeficency: no  Diagnostics: Spirometry:  Tracings reviewed. His effort: Good reproducible efforts. FVC: 6.21L FEV1: 4.25L, 79% predicted FEV1/FVC ratio: 68% Interpretation: Spirometry consistent with mild obstructive disease with 14% improvement in FEV1 post bronchodilator treatment.  Please see scanned spirometry results for details.  Skin Testing: Environmental allergy panel and select foods. Positive to grass, weed, ragweed, trees, mold, dust mites, cat, dog and cockroach. Borderline to casein, shellfish - okay to eat as before. Borderline to positive to fire ant.  Results discussed with patient/family. Airborne Adult Perc - 06/02/19 1016    Time Antigen Placed  1016    Allergen Manufacturer  Lavella Hammock    Location  Back    Number of Test  59    Panel 1  Select    1. Control-Buffer 50% Glycerol  Negative    2. Control-Histamine 1  mg/ml  2+    3. Albumin saline  Negative    4. [place]  4+    5. [place]  4+    6. Johnson  4+    7. Arcadia  4+    8. Meadow Fescue  4+    9. Perennial Rye  4+    10. Sweet Vernal  4+    11. Timothy  Negative    12. Cocklebur  2+    13. Burweed Marshelder  Negative    14. Ragweed, short  4+    15. Ragweed, Giant  2+    16. Plantain,  English  2+    17. Lamb's Quarters  --   +/-   18. Sheep Sorrell  --   +/-   19. Rough Pigweed  Negative    20. Marsh Elder, Rough  Negative    21. Mugwort, Common  Negative    22. Ash mix  Negative    23. Birch mix  Negative    24. Beech American  Negative    25. Box, Elder  2+    26. Cedar, red  Negative    27. Cottonwood, [place]  Negative    28. Elm mix  Negative    29. Hickory  4+    30. Maple mix  2+    31. Oak, [place] mix  2+    32. Pecan Pollen  4+    33. Pine mix  Negative    34. Revere  2+    35. Walnut, Black Pollen  3+    36. Alternaria alternata  Negative    37. Cladosporium Herbarum  Negative    38. Aspergillus mix  2+    39. Penicillium mix  --   +/-   40. Bipolaris sorokiniana (Helminthosporium)  Negative    41. Drechslera spicifera (Curvularia)  Negative    42. Mucor plumbeus  Negative    43. Fusarium moniliforme  Negative    44. Aureobasidium pullulans (pullulara)  Negative    45. Rhizopus oryzae  Negative    46. Botrytis cinera  Negative    47. Epicoccum nigrum  Negative    48. Phoma betae  Negative    49. Candida Albicans  Negative    50. Trichophyton mentagrophytes  Negative    51. Mite, D Farinae  5,000 AU/ml  3+    52. Mite, D Pteronyssinus  5,000 AU/ml  3+    53. Cat Hair 10,000 BAU/ml  3+    54.  Dog Epithelia  Negative    55. Mixed Feathers  Negative    56. Horse Epithelia  Negative    57. Cockroach, German  Negative    58. Mouse  Negative    59. Tobacco Leaf  Negative     Intradermal - 06/02/19 1036    Time Antigen Placed  1050    Allergen Manufacturer  Greer    Location  Arm     Number of Test  6    Intradermal  Select    Control  Negative    Mold 1  Negative    Mold 3  --   +/-   Mold 4  --   +/-   Dog  3+    Cockroach  2+     Food Adult Perc - 06/02/19 1000    Time Antigen Placed  1016    Allergen Manufacturer  Greer    Location  Back    Number of allergen test  11    1. Peanut  Negative    2. Soybean  Negative    3. Wheat  Negative    4. Sesame  Negative    5. Milk, cow  Negative    6. Egg White, Chicken  Negative    7. Casein  --   +/-   8. Shellfish Mix  --   2x2   9. Fish Mix  Negative    10. Cashew  Negative    6. Other  --   Animator +/-      Past Medical History: Patient Active Problem List   Diagnosis Date Noted  . Mild persistent asthma without complication 06/02/2019  . Other allergic rhinitis 06/02/2019  . Allergic conjunctivitis of both eyes 06/02/2019  . Fire ant bite 06/02/2019   Past Medical History:  Diagnosis Date  . Mild persistent asthma without complication 06/02/2019   Past Surgical History: Past Surgical History:  Procedure Laterality Date  . NO PAST SURGERIES     Medication List:  Current Outpatient Medications  Medication Sig Dispense Refill  . albuterol (VENTOLIN HFA) 108 (90 Base) MCG/ACT inhaler Inhale 2 puffs into the lungs every 6 (six) hours as needed for wheezing or shortness of breath. 18 g 3  . cetirizine (ZYRTEC) 10 MG tablet Take 1 tablet (10 mg total) by mouth daily. 30 tablet 1  . Fluticasone Furoate (ARNUITY ELLIPTA) 100 MCG/ACT AEPB Inhale 1 puff into the lungs daily. Rinse mouth after each use. 30  each 5   No current facility-administered medications for this visit.   Allergies: Allergies  Allergen Reactions  . Other    Social History: Social History   Socioeconomic History  . Marital status: Single    Spouse name: Not on file  . Number of children: Not on file  . Years of education: Not on file  . Highest education level: Not on file  Occupational History  . Not on file    Tobacco Use  . Smoking status: Never Smoker  . Smokeless tobacco: Never Used  Substance and Sexual Activity  . Alcohol use: No  . Drug use: No  . Sexual activity: Yes  Other Topics Concern  . Not on file  Social History Narrative  . Not on file   Social Determinants of Health   Financial Resource Strain:   . Difficulty of Paying Living Expenses:   Food Insecurity:   . Worried About Programme researcher, broadcasting/film/video in the Last Year:   . Barista in the Last Year:   Transportation Needs:   . Freight forwarder (Medical):   Marland Kitchen Lack of Transportation (Non-Medical):   Physical Activity:   . Days of Exercise per Week:   . Minutes of Exercise per Session:   Stress:   . Feeling of Stress :   Social Connections:   . Frequency of Communication with Friends and Family:   . Frequency of Social Gatherings with Friends and Family:   . Attends Religious Services:   . Active Member of Clubs or Organizations:   . Attends Banker Meetings:   Marland Kitchen Marital Status:    Lives in a 28 year old home. Smoking: denies Occupation: Ecologist HistorySurveyor, minerals in the house: no Engineer, civil (consulting) in the family room: no Carpet in the bedroom: no Heating: electric Cooling: central Pet: no but parents have 3 dogs at home.   Family History: Family History  Problem Relation Age of Onset  . Asthma Sister   . Allergic rhinitis Sister   . Eczema Sister   . Angioedema Neg Hx   . Atopy Neg Hx   . Immunodeficiency Neg Hx   . Urticaria Neg Hx    Review of Systems  Constitutional: Negative for appetite change, chills, fever and unexpected weight change.  HENT: Positive for congestion, rhinorrhea and sneezing.   Eyes: Positive for itching.  Respiratory: Positive for wheezing. Negative for cough, chest tightness and shortness of breath.   Cardiovascular: Negative for chest pain.  Gastrointestinal: Negative for abdominal pain.  Genitourinary: Negative for difficulty  urinating.  Skin: Negative for rash.  Allergic/Immunologic: Positive for environmental allergies.  Neurological: Negative for headaches.   Objective: BP 120/82 (BP Location: Left Arm, Patient Position: Sitting, Cuff Size: Normal)   Pulse 72   Temp 98.1 F (36.7 C) (Temporal)   Resp 18   Ht 6\' 3"  (1.905 m)   Wt 224 lb (101.6 kg)   SpO2 98%   BMI 28.00 kg/m  Body mass index is 28 kg/m. Physical Exam  Constitutional: He is oriented to person, place, and time. He appears well-developed and well-nourished.  HENT:  Head: Normocephalic and atraumatic.  Right Ear: External ear normal.  Left Ear: External ear normal.  Mouth/Throat: Oropharynx is clear and moist.  Right nasal turbinate hypertrophy  Eyes: Conjunctivae and EOM are normal.  Cardiovascular: Normal rate, regular rhythm and normal heart sounds. Exam reveals no gallop and no friction rub.  No murmur heard. Pulmonary/Chest: Effort  normal and breath sounds normal. He has no wheezes. He has no rales.  Abdominal: Soft.  Musculoskeletal:     Cervical back: Neck supple.  Neurological: He is alert and oriented to person, place, and time.  Skin: Skin is warm. No rash noted.  Psychiatric: He has a normal mood and affect. His behavior is normal.  Nursing note and vitals reviewed.  The plan was reviewed with the patient/family, and all questions/concerned were addressed.  It was my pleasure to see Raymond Knapp today and participate in his care. Please feel free to contact me with any questions or concerns.  Sincerely,  Wyline MoodYoon Conya Ellinwood, DO Allergy & Immunology  [facility] [place] [place] office: [phone] [facility] [place] office: [phone] [place] office: [phone]

## 2019-06-02 NOTE — Assessment & Plan Note (Signed)
Broke out in hives after ant bites with no other systemic symptoms.  Today's skin prick testing borderline positive to fire ant.  Try to avoid fire ants.  For mild symptoms you can take over the counter antihistamines such as Benadryl and monitor symptoms closely. If symptoms worsen or if you have severe symptoms including breathing issues, throat closure, significant swelling, whole body hives, severe diarrhea and vomiting, lightheadedness then seek immediate medical care.

## 2019-06-02 NOTE — Assessment & Plan Note (Addendum)
Rhinoconjunctivitis symptoms for 20+ years mainly around cats and dogs.  Using Zyrtec as needed with some benefit.  No prior allergy evaluation.  Today's skin testing showed: Significant positive to grass, weed, ragweed, trees, mold, dust mites, cat, dog and cockroach. Borderline to casein, shellfish - okay to eat as before most likely irrelevant sensitization. Borderline to positive to fire ant.   Start environmental control measures as below.  Interestingly patient did not complain of daily rhinoconjunctivitis symptoms and will continue to use over the counter antihistamines such as Zyrtec (cetirizine), Claritin (loratadine), Allegra (fexofenadine), or Xyzal (levocetirizine) daily as needed.  Read about allergy injections - handout given.  If you have worsening nasal or eye symptoms let us know and I can send in a prescription for nasal spray and/or eye drops.

## 2019-06-02 NOTE — Patient Instructions (Addendum)
Today's skin testing showed: Positive to grass, weed, ragweed, trees, mold, dust mites, cat, dog and cockroach. Borderline to casein, shellfish - okay to eat as before. Borderline to positive to fire ant.   Environmental allergies  Start environmental control measures as below.  May use over the counter antihistamines such as Zyrtec (cetirizine), Claritin (loratadine), Allegra (fexofenadine), or Xyzal (levocetirizine) daily as needed.  Read about allergy injections.  If you have worsening nasal or eye symptoms let us know and I can send in a prescription for nasal spray and/or eye drops.  Breathing . Daily controller medication(s): Start Arnuity 100 1 puff daily and rinse mouth after each use.  . May use albuterol rescue inhaler 2 puffs every 4 to 6 hours as needed for shortness of breath, chest tightness, coughing, and wheezing. May use albuterol rescue inhaler 2 puffs 5 to 15 minutes prior to strenuous physical activities. Monitor frequency of use.  . Asthma control goals:  o Full participation in all desired activities (may need albuterol before activity) o Albuterol use two times or less a week on average (not counting use with activity) o Cough interfering with sleep two times or less a month o Oral steroids no more than once a year o No hospitalizations  Fire ant  For mild symptoms you can take over the counter antihistamines such as Benadryl and monitor symptoms closely. If symptoms worsen or if you have severe symptoms including breathing issues, throat closure, significant swelling, whole body hives, severe diarrhea and vomiting, lightheadedness then seek immediate medical care.  Follow up in 2 months or sooner if needed.   Reducing Pollen Exposure . Pollen seasons: trees (spring), grass (summer) and ragweed/weeds (fall). Marland Kitchen Keep windows closed in your home and car to lower pollen exposure.  Raymond Knapp air conditioning in the bedroom and throughout the house if possible.   . Avoid going out in dry windy days - especially early morning. . Pollen counts are highest between 5 - 10 AM and on dry, hot and windy days.  . Save outside activities for late afternoon or after a heavy rain, when pollen levels are lower.  . Avoid mowing of grass if you have grass pollen allergy. Marland Kitchen Be aware that pollen can also be transported indoors on people and pets.  . Dry your clothes in an automatic dryer rather than hanging them outside where they might collect pollen.  . Rinse hair and eyes before bedtime. Mold Control . Mold and fungi can grow on a variety of surfaces provided certain temperature and moisture conditions exist.  . Outdoor molds grow on plants, decaying vegetation and soil. The major outdoor mold, Alternaria and Cladosporium, are found in very high numbers during hot and dry conditions. Generally, a late summer - fall peak is seen for common outdoor fungal spores. Rain will temporarily lower outdoor mold spore count, but counts rise rapidly when the rainy period ends. . The most important indoor molds are Aspergillus and Penicillium. Dark, humid and poorly ventilated basements are ideal sites for mold growth. The next most common sites of mold growth are the bathroom and the kitchen. Outdoor (Seasonal) Mold Control . Use air conditioning and keep windows closed. . Avoid exposure to decaying vegetation. Marland Kitchen Avoid leaf raking. . Avoid grain handling. . Consider wearing a face mask if working in moldy areas.  Indoor (Perennial) Mold Control  . Maintain humidity below 50%. . Get rid of mold growth on hard surfaces with water, detergent and, if necessary, 5% bleach (  do not mix with other cleaners). Then dry the area completely. If mold covers an area more than 10 square feet, consider hiring an indoor environmental professional. . For clothing, washing with soap and water is best. If moldy items cannot be cleaned and dried, throw them away. . Remove sources e.g. contaminated  carpets. . Repair and seal leaking roofs or pipes. Using dehumidifiers in damp basements may be helpful, but empty the water and clean units regularly to prevent mildew from forming. All rooms, especially basements, bathrooms and kitchens, require ventilation and cleaning to deter mold and mildew growth. Avoid carpeting on concrete or damp floors, and storing items in damp areas. Control of House Dust Mite Allergen . Dust mite allergens are a common trigger of allergy and asthma symptoms. While they can be found throughout the house, these microscopic creatures thrive in warm, humid environments such as bedding, upholstered furniture and carpeting. . Because so much time is spent in the bedroom, it is essential to reduce mite levels there.  . Encase pillows, mattresses, and box springs in special allergen-proof fabric covers or airtight, zippered plastic covers.  . Bedding should be washed weekly in hot water (130 F) and dried in a hot dryer. Allergen-proof covers are available for comforters and pillows that can't be regularly washed.  Raymond Knapp the allergy-proof covers every few months. Minimize clutter in the bedroom. Keep pets out of the bedroom.  Marland Kitchen Keep humidity less than 50% by using a dehumidifier or air conditioning. You can buy a humidity measuring device called a hygrometer to monitor this.  . If possible, replace carpets with hardwood, linoleum, or washable area rugs. If that's not possible, vacuum frequently with a vacuum that has a HEPA filter. . Remove all upholstered furniture and non-washable window drapes from the bedroom. . Remove all non-washable stuffed toys from the bedroom.  Wash stuffed toys weekly. Pet Allergen Avoidance: . Contrary to popular opinion, there are no "hypoallergenic" breeds of dogs or cats. That is because people are not allergic to an animal's hair, but to an allergen found in the animal's saliva, dander (dead skin flakes) or urine. Pet allergy symptoms typically  occur within minutes. For some people, symptoms can build up and become most severe 8 to 12 hours after contact with the animal. People with severe allergies can experience reactions in public places if dander has been transported on the pet owners' clothing. Marland Kitchen Keeping an animal outdoors is only a partial solution, since homes with pets in the yard still have higher concentrations of animal allergens. . Before getting a pet, ask your allergist to determine if you are allergic to animals. If your pet is already considered part of your family, try to minimize contact and keep the pet out of the bedroom and other rooms where you spend a great deal of time. . As with dust mites, vacuum carpets often or replace carpet with a hardwood floor, tile or linoleum. . High-efficiency particulate air (HEPA) cleaners can reduce allergen levels over time. . While dander and saliva are the source of cat and dog allergens, urine is the source of allergens from rabbits, hamsters, mice and [place] pigs; so ask a non-allergic family member to clean the animal's cage. . If you have a pet allergy, talk to your allergist about the potential for allergy immunotherapy (allergy shots). This strategy can often provide long-term relief. Cockroach Allergen Avoidance Cockroaches are often found in the homes of densely populated urban areas, schools or commercial buildings, but  these creatures can lurk almost anywhere. This does not mean that you have a dirty house or living area. . Block all areas where roaches can enter the home. This includes crevices, wall cracks and windows.  . Cockroaches need water to survive, so fix and seal all leaky faucets and pipes. Have an exterminator go through the house when your family and pets are gone to eliminate any remaining roaches. Marland Kitchen Keep food in lidded containers and put pet food dishes away after your pets are done eating. Vacuum and sweep the floor after meals, and take out garbage and  recyclables. Use lidded garbage containers in the kitchen. Wash dishes immediately after use and clean under stoves, refrigerators or toasters where crumbs can accumulate. Wipe off the stove and other kitchen surfaces and cupboards regularly.

## 2019-08-08 DIAGNOSIS — H1010 Acute atopic conjunctivitis, unspecified eye: Secondary | ICD-10-CM | POA: Insufficient documentation

## 2019-08-08 NOTE — Progress Notes (Deleted)
Follow Up Note  RE: Raymond Knapp MRN: [id number] DOB: 1990-03-17 Date of Office Visit: 08/09/2019  Referring provider: Georgina Quint, * Primary care provider: Georgina Quint, MD  Chief Complaint: No chief complaint on file.  History of Present Illness: I had the pleasure of seeing Raymond Knapp for a follow up visit at the [facility] on 08/08/2019. He is a 29 y.o. male, who is being followed for asthma, allergic rhinoconjunctivitis and fire ant sensitivity. His previous allergy office visit was on 06/02/2019 with Dr. Selena Batten. Today is a regular follow up visit.  Mild persistent asthma without complication No previous diagnosis of asthma however noted some wheezing, chest tightness, shortness of breath and nocturnal awakenings for 1 year especially if exposed to pet or exertion in the cold weather.  Using albuterol depending on circumstances with good benefit.  Patient is a IT sales professional.  Today's spirometry showed mild obstruction with 14% improvement in FEV1 post bronchodilator treatment concerning for asthma.  Daily controller medication(s):Start Arnuity 100 1 puff daily and rinse mouth after each use.   May use albuterol rescue inhaler 2 puffs every 4 to 6 hours as needed for shortness of breath, chest tightness, coughing, and wheezing. May use albuterol rescue inhaler 2 puffs 5 to 15 minutes prior to strenuous physical activities. Monitor frequency of use.   Repeat spirometry at next visit.  Other allergic rhinitis Rhinoconjunctivitis symptoms for 20+ years mainly around cats and dogs.  Using Zyrtec as needed with some benefit.  No prior allergy evaluation.  Today's skin testing showed: Significant positive to grass, weed, ragweed, trees, mold, dust mites, cat, dog and cockroach. Borderline to casein, shellfish - okay to eat as before most likely irrelevant sensitization. Borderline to positive to fire ant.   Start environmental control measures as  below.  Interestingly patient did not complain of daily rhinoconjunctivitis symptoms and will continue to use over the counter antihistamines such as Zyrtec (cetirizine), Claritin (loratadine), Allegra (fexofenadine), or Xyzal (levocetirizine) daily as needed.  Read about allergy injections - handout given.  If you have worsening nasal or eye symptoms let us know and I can send in a prescription for nasal spray and/or eye drops.  Allergic conjunctivitis of both eyes  See assessment and plan as above for allergic rhinitis.  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  Today's skin prick testing borderline positive to fire ant.  Try to avoid fire ants.  For mild symptoms you can take over the counter antihistamines such as Benadryl and monitor symptoms closely. If symptoms worsen or if you have severe symptoms including breathing issues, throat closure, significant swelling, whole body hives, severe diarrhea and vomiting, lightheadedness then seek immediate medical care.  Return in about 2 months (around 08/02/2019).  Assessment and Plan: Raymond Knapp is a 29 y.o. male with: No problem-specific Assessment & Plan notes found for this encounter.  No follow-ups on file.  No orders of the defined types were placed in this encounter.  Lab Orders  No laboratory test(s) ordered today    Diagnostics: Spirometry:  Tracings reviewed. His effort: {Blank single:19197::"Good reproducible efforts.","It was hard to get consistent efforts and there is a question as to whether this reflects a maximal maneuver.","Poor effort, data can not be interpreted."} FVC: ***L FEV1: ***L, ***% predicted FEV1/FVC ratio: ***% Interpretation: {Blank single:19197::"Spirometry consistent with mild obstructive disease","Spirometry consistent with moderate obstructive disease","Spirometry consistent with severe obstructive disease","Spirometry consistent with possible restrictive  disease","Spirometry consistent with mixed obstructive and restrictive disease","Spirometry uninterpretable due to technique","Spirometry consistent with normal pattern","No overt abnormalities noted given today's efforts"}.  Please see scanned spirometry results for details.  Skin Testing: {Blank single:19197::"Select foods","Environmental allergy panel","Environmental allergy panel and select foods","Food allergy panel","None","Deferred due to recent antihistamines use"}. Positive test to: ***. Negative test to: ***.  Results discussed with patient/family.   Medication List:  Current Outpatient Medications  Medication Sig Dispense Refill  . albuterol (VENTOLIN HFA) 108 (90 Base) MCG/ACT inhaler Inhale 2 puffs into the lungs every 6 (six) hours as needed for wheezing or shortness of breath. 18 g 3  . cetirizine (ZYRTEC) 10 MG tablet Take 1 tablet (10 mg total) by mouth daily. 30 tablet 1  . Fluticasone Furoate (ARNUITY ELLIPTA) 100 MCG/ACT AEPB Inhale 1 puff into the lungs daily. Rinse mouth after each use. 30 each 5   No current facility-administered medications for this visit.   Allergies: Allergies  Allergen Reactions  . Other    I reviewed his past medical history, social history, family history, and environmental history and no significant changes have been reported from his previous visit.  Review of Systems  Constitutional: Negative for appetite change, chills, fever and unexpected weight change.  HENT: Positive for congestion, rhinorrhea and sneezing.   Eyes: Positive for itching.  Respiratory: Positive for wheezing. Negative for cough, chest tightness and shortness of breath.   Cardiovascular: Negative for chest pain.  Gastrointestinal: Negative for abdominal pain.  Genitourinary: Negative for difficulty urinating.  Skin: Negative for rash.  Allergic/Immunologic: Positive for environmental allergies.  Neurological: Negative for headaches.   Objective: There were no  vitals taken for this visit. There is no height or weight on file to calculate BMI. Physical Exam Vitals and nursing note reviewed.  Constitutional:      Appearance: He is well-developed.  HENT:     Head: Normocephalic and atraumatic.     Right Ear: External ear normal.     Left Ear: External ear normal.  Eyes:     Conjunctiva/sclera: Conjunctivae normal.  Cardiovascular:     Rate and Rhythm: Normal rate and regular rhythm.     Heart sounds: Normal heart sounds. No murmur heard.  No friction rub. No gallop.   Pulmonary:     Effort: Pulmonary effort is normal.     Breath sounds: Normal breath sounds. No wheezing or rales.  Abdominal:     Palpations: Abdomen is soft.  Musculoskeletal:     Cervical back: Neck supple.  Skin:    General: Skin is warm.     Findings: No rash.  Neurological:     Mental Status: He is alert and oriented to person, place, and time.  Psychiatric:        Behavior: Behavior normal.    Previous notes and tests were reviewed. The plan was reviewed with the patient/family, and all questions/concerned were addressed.  It was my pleasure to see Brayon today and participate in his care. Please feel free to contact me with any questions or concerns.  Sincerely,  Wyline Mood, DO Allergy & Immunology  [facility] office: [phone] [facility] office: [phone] [place] office: [phone]

## 2019-08-09 ENCOUNTER — Ambulatory Visit: Payer: 59 | Admitting: Allergy

## 2021-02-07 ENCOUNTER — Telehealth (INDEPENDENT_AMBULATORY_CARE_PROVIDER_SITE_OTHER): Payer: 59 | Admitting: Internal Medicine

## 2021-02-07 ENCOUNTER — Other Ambulatory Visit: Payer: Self-pay | Admitting: Emergency Medicine

## 2021-02-07 ENCOUNTER — Telehealth: Payer: Self-pay

## 2021-02-07 DIAGNOSIS — L237 Allergic contact dermatitis due to plants, except food: Secondary | ICD-10-CM

## 2021-02-07 DIAGNOSIS — L509 Urticaria, unspecified: Secondary | ICD-10-CM

## 2021-02-07 MED ORDER — TRIAMCINOLONE ACETONIDE 0.1 % EX CREA: 1.0000 "application " | TOPICAL_CREAM | Freq: Two times a day (BID) | CUTANEOUS | 1 refills | Status: AC

## 2021-02-07 NOTE — Telephone Encounter (Signed)
Pt is calling in wanting to know what else he can use or take for poison ivy. 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   Pt has tried [employer], some kind of spray for itching, Alcohol.  Its on Lt arm, Lt side and stomach.  Please advise Pt CB number is [phone]

## 2021-02-07 NOTE — Telephone Encounter (Signed)
He just had a virtual visit today 02/07/2021 at [facility] at [place] for the same reason. Last time I saw him was 2 years ago.  He should follow-up with Dr. Ardyth Harps, the internal medicine doctor just saw him today. Thanks.

## 2021-02-07 NOTE — Progress Notes (Signed)
Virtual Visit via Video Note  I connected with Raymond Knapp on 02/07/21 at 11:30 AM EST by a video enabled telemedicine application and verified that I am speaking with the correct person using two identifiers.  Location patient: home Location provider: work office Persons participating in the virtual visit: patient, provider  I discussed the limitations of evaluation and management by telemedicine and the availability of in person appointments. The patient expressed understanding and agreed to proceed.   HPI: He has scheduled this visit to discuss a rash that has appeared on his left forearm.  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  The next day he started having a rash on his left forearm, it seems to be spreading around his stomach area on the same side where he feels like he probably rubs his forearm while he sleeps.  It is very pruritic.  It started as small red bumps but now also has some linear spots as well.   ROS: Constitutional: Denies fever, chills, diaphoresis, appetite change and fatigue.  HEENT: Denies photophobia, eye pain, redness, hearing loss, ear pain, congestion, sore throat, rhinorrhea, sneezing, mouth sores, trouble swallowing, neck pain, neck stiffness and tinnitus.   Respiratory: Denies SOB, DOE, cough, chest tightness,  and wheezing.   Cardiovascular: Denies chest pain, palpitations and leg swelling.  Gastrointestinal: Denies nausea, vomiting, abdominal pain, diarrhea, constipation, blood in stool and abdominal distention.  Genitourinary: Denies dysuria, urgency, frequency, hematuria, flank pain and difficulty urinating.  Endocrine: Denies: hot or cold intolerance, sweats, changes in hair or nails, polyuria, polydipsia. Musculoskeletal: Denies myalgias, back pain, joint swelling, arthralgias and gait problem.   Neurological: Denies dizziness, seizures, syncope, weakness, light-headedness, numbness  and headaches.  Hematological: Denies adenopathy. Easy bruising, personal or family bleeding history  Psychiatric/Behavioral: Denies suicidal ideation, mood changes, confusion, nervousness, sleep disturbance and agitation   Past Medical History:  Diagnosis Date   Mild persistent asthma without complication 06/02/2019    Past Surgical History:  Procedure Laterality Date   NO PAST SURGERIES      Family History  Problem Relation Age of Onset   Asthma Sister    Allergic rhinitis Sister    Eczema Sister    Angioedema Neg Hx    Atopy Neg Hx    Immunodeficiency Neg Hx    Urticaria Neg Hx     SOCIAL HX:   reports that he has never smoked. He has never used smokeless tobacco. He reports that he does not drink alcohol and does not use drugs.   Current Outpatient Medications:    triamcinolone cream (KENALOG) 0.1 %, Apply 1 application topically 2 (two) times daily., Disp: 30 g, Rfl: 1   albuterol (VENTOLIN HFA) 108 (90 Base) MCG/ACT inhaler, Inhale 2 puffs into the lungs every 6 (six) hours as needed for wheezing or shortness of breath. (Patient not taking: Reported on 02/07/2021), Disp: 18 g, Rfl: 3   cetirizine (ZYRTEC) 10 MG tablet, Take 1 tablet (10 mg total) by mouth daily., Disp: 30 tablet, Rfl: 1   Fluticasone Furoate (ARNUITY ELLIPTA) 100 MCG/ACT AEPB, Inhale 1 puff into the lungs daily. Rinse mouth after each use. (Patient not taking: Reported on 02/07/2021), Disp: 30 each, Rfl: 5  EXAM:   VITALS per patient if applicable: None reported  GENERAL: alert, oriented, appears well and in no acute distress  HEENT: atraumatic, conjunttiva clear, no obvious abnormalities on inspection  of external nose and ears  NECK: normal movements of the head and neck  LUNGS: on inspection no signs of respiratory distress, breathing rate appears normal, no obvious gross increased work of breathing, gasping or wheezing  CV: no obvious cyanosis  MS: moves all visible extremities without  noticeable abnormality  PSYCH/NEURO: pleasant and cooperative, no obvious depression or anxiety, speech and thought processing grossly intact  ASSESSMENT AND PLAN:   Poison ivy dermatitis  - Plan: triamcinolone cream (KENALOG) 0.1 % -This has the appearance of a classic poison ivy/oak rash. -Steroid cream to apply 2-3 times a day. -He knows to follow-up with [place] if it fails to resolve.     I discussed the assessment and treatment plan with the patient. The patient was provided an opportunity to ask questions and all were answered. The patient agreed with the plan and demonstrated an understanding of the instructions.   The patient was advised to call back or seek an in-person evaluation if the symptoms worsen or if the condition fails to improve as anticipated.    Lelon Frohlich, MD  [facility]

## 2021-04-09 IMAGING — CR CHEST  1 VIEW
1 series · 1 of 1 positions shown · non-contrast
Comparison: No prior.

CLINICAL DATA: Pre-employment evaluation.

EXAM:
CHEST  1 VIEW

[w chest pa]
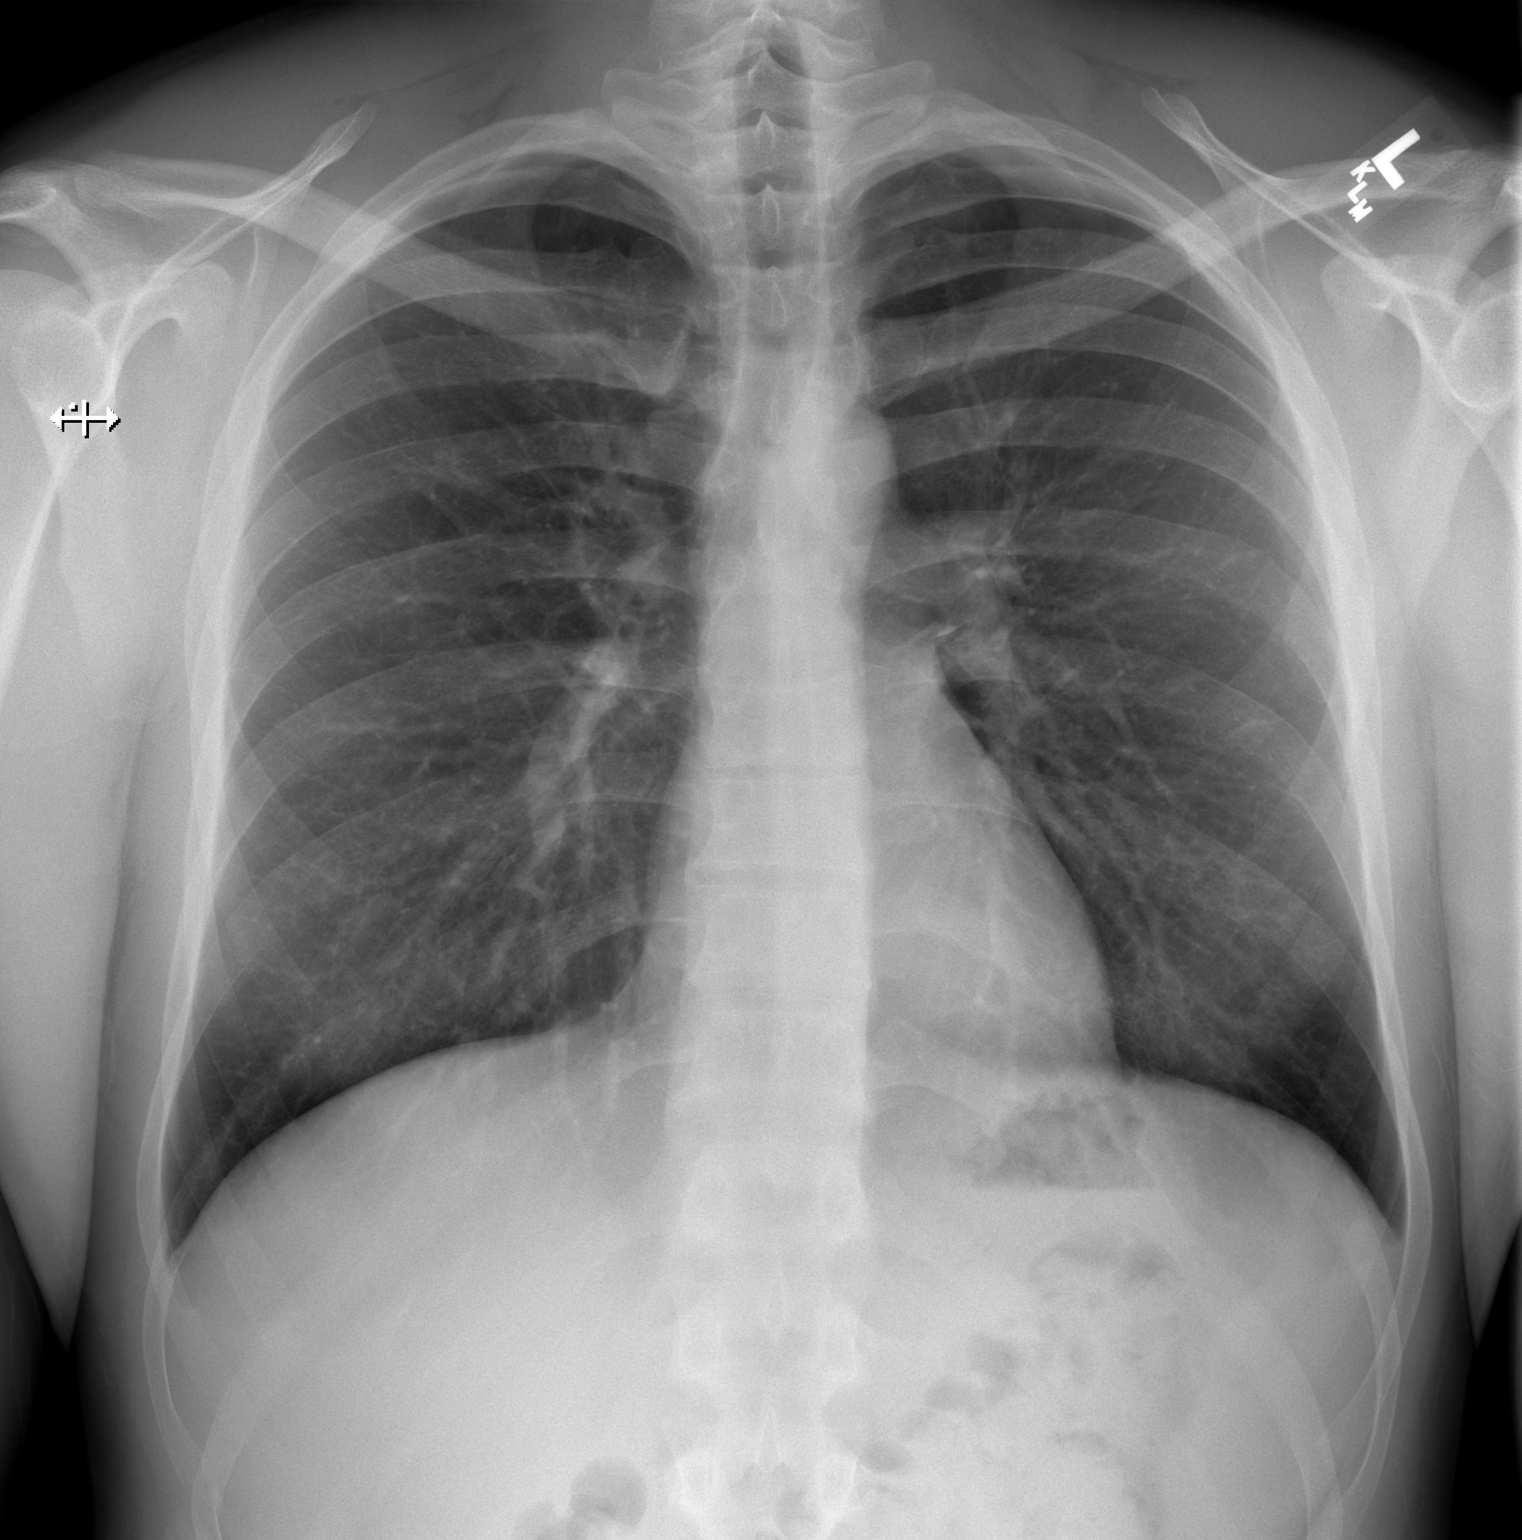

[1 of 1 positions shown; findings below may reference images not displayed]

FINDINGS: Mediastinum and hilar structures normal. Heart size normal. Lungs
are clear. No pleural effusion or pneumothorax. No acute bony
abnormality.
IMPRESSION: No acute cardiopulmonary disease.

## 2021-12-21 ENCOUNTER — Telehealth: Payer: No Typology Code available for payment source

## 2021-12-21 ENCOUNTER — Telehealth: Payer: 59 | Admitting: Family Medicine

## 2021-12-21 DIAGNOSIS — R6889 Other general symptoms and signs: Secondary | ICD-10-CM

## 2021-12-21 MED ORDER — OSELTAMIVIR PHOSPHATE 75 MG PO CAPS: 75.0000 mg | ORAL_CAPSULE | Freq: Two times a day (BID) | ORAL | 0 refills | Status: AC

## 2021-12-21 NOTE — Progress Notes (Signed)
E visit for Flu like symptoms   We are sorry that you are not feeling well.  Here is how we plan to help! Based on what you have shared with me it looks like you may have possible exposure to a virus that causes influenza.  Influenza or "the flu" is   an infection caused by a respiratory virus. The flu virus is highly contagious and persons who did not receive their yearly flu vaccination may "catch" the flu from close contact.  We have anti-viral medications to treat the viruses that cause this infection. They are not a "cure" and only shorten the course of the infection. These prescriptions are most effective when they are given within the first 2 days of "flu" symptoms. Antiviral medication are indicated if you have a high risk of complications from the flu. You should  also consider an antiviral medication if you are in close contact with someone who is at risk. These medications can help patients avoid complications from the flu  but have side effects that you should know. Possible side effects from Tamiflu or oseltamivir include nausea, vomiting, diarrhea, dizziness, headaches, eye redness, sleep problems or other respiratory symptoms. You should not take Tamiflu if you have an allergy to oseltamivir or any to the ingredients in Tamiflu.  Based upon your symptoms and potential risk factors I have prescribed Oseltamivir (Tamiflu).  It has been sent to your designated pharmacy.  You will take one 75 mg capsule orally twice a day for the next 5 days. and I recommend that you follow the flu symptoms recommendation that I have listed below.  ANYONE WHO HAS FLU SYMPTOMS SHOULD: Stay home. The flu is highly contagious and going out or to work exposes others! Be sure to drink plenty of fluids. Water is fine as well as fruit juices, sodas and electrolyte beverages. You may want to stay away from caffeine or alcohol. If you are nauseated, try taking small sips of liquids. How do you know if you are getting  enough fluid? Your urine should be a pale yellow or almost colorless. Get rest. Taking a steamy shower or using a humidifier may help nasal congestion and ease sore throat pain. Using a saline nasal spray works much the same way. Cough drops, hard candies and sore throat lozenges may ease your cough. Line up a caregiver. Have someone check on you regularly.   GET HELP RIGHT AWAY IF: You cannot keep down liquids or your medications. You become short of breath Your fell like you are going to pass out or loose consciousness. Your symptoms persist after you have completed your treatment plan MAKE SURE YOU  Understand these instructions. Will watch your condition. Will get help right away if you are not doing well or get worse.  Your e-visit answers were reviewed by a board certified advanced clinical practitioner to complete your personal care plan.  Depending on the condition, your plan could have included both over the counter or prescription medications.  If there is a problem please reply  once you have received a response from your provider.  Your safety is important to us.  If you have drug allergies check your prescription carefully.    You can use MyChart to ask questions about today's visit, request a non-urgent call back, or ask for a work or school excuse for 24 hours related to this e-Visit. If it has been greater than 24 hours you will need to follow up with your provider, or enter   a new e-Visit to address those concerns.  You will get an e-mail in the next two days asking about your experience.  I hope that your e-visit has been valuable and will speed your recovery. Thank you for using e-visits.  I provided 5 minutes of non face-to-face time during this encounter for chart review, medication and order placement, as well as and documentation.
